# Patient Record
Sex: Female | Born: 1935 | Race: White | Hispanic: No | Marital: Married | Smoking: Never smoker
Health system: Southern US, Community
[De-identification: ages and names within clinical notes are randomized; demographics above are authoritative.]

## PROBLEM LIST (undated history)

## (undated) DIAGNOSIS — I471 Supraventricular tachycardia, unspecified: Secondary | ICD-10-CM

## (undated) DIAGNOSIS — T8859XA Other complications of anesthesia, initial encounter: Secondary | ICD-10-CM

## (undated) DIAGNOSIS — E039 Hypothyroidism, unspecified: Secondary | ICD-10-CM

## (undated) DIAGNOSIS — T4145XA Adverse effect of unspecified anesthetic, initial encounter: Secondary | ICD-10-CM

## (undated) DIAGNOSIS — K432 Incisional hernia without obstruction or gangrene: Secondary | ICD-10-CM

## (undated) DIAGNOSIS — Z87442 Personal history of urinary calculi: Secondary | ICD-10-CM

## (undated) DIAGNOSIS — Z9889 Other specified postprocedural states: Secondary | ICD-10-CM

## (undated) DIAGNOSIS — F419 Anxiety disorder, unspecified: Secondary | ICD-10-CM

## (undated) DIAGNOSIS — F0390 Unspecified dementia without behavioral disturbance: Secondary | ICD-10-CM

## (undated) DIAGNOSIS — R112 Nausea with vomiting, unspecified: Secondary | ICD-10-CM

## (undated) DIAGNOSIS — C801 Malignant (primary) neoplasm, unspecified: Secondary | ICD-10-CM

## (undated) HISTORY — PX: EYE SURGERY: SHX253

## (undated) HISTORY — PX: FRACTURE SURGERY: SHX138

## (undated) HISTORY — PX: CATARACT EXTRACTION, BILATERAL: SHX1313

## (undated) HISTORY — PX: HERNIA REPAIR: SHX51

## (undated) HISTORY — PX: THYROIDECTOMY, PARTIAL: SHX18

## (undated) HISTORY — PX: CARDIAC CATHETERIZATION: SHX172

---

## 2017-10-06 ENCOUNTER — Encounter (HOSPITAL_COMMUNITY): Payer: Self-pay | Admitting: Emergency Medicine

## 2017-10-06 ENCOUNTER — Emergency Department (HOSPITAL_COMMUNITY): Payer: Medicare Other

## 2017-10-06 ENCOUNTER — Emergency Department (HOSPITAL_COMMUNITY)
Admission: EM | Admit: 2017-10-06 | Discharge: 2017-10-06 | Disposition: A | Discharge status: Home / Self Care | Payer: Medicare Other | Source: Home / Self Care | Attending: Emergency Medicine | Admitting: Emergency Medicine

## 2017-10-06 DIAGNOSIS — E039 Hypothyroidism, unspecified: Secondary | ICD-10-CM | POA: Insufficient documentation

## 2017-10-06 DIAGNOSIS — Y999 Unspecified external cause status: Secondary | ICD-10-CM | POA: Insufficient documentation

## 2017-10-06 DIAGNOSIS — S0990XA Unspecified injury of head, initial encounter: Secondary | ICD-10-CM | POA: Insufficient documentation

## 2017-10-06 DIAGNOSIS — N390 Urinary tract infection, site not specified: Secondary | ICD-10-CM

## 2017-10-06 DIAGNOSIS — W01190A Fall on same level from slipping, tripping and stumbling with subsequent striking against furniture, initial encounter: Secondary | ICD-10-CM | POA: Insufficient documentation

## 2017-10-06 DIAGNOSIS — R0789 Other chest pain: Secondary | ICD-10-CM | POA: Insufficient documentation

## 2017-10-06 DIAGNOSIS — Y929 Unspecified place or not applicable: Secondary | ICD-10-CM | POA: Insufficient documentation

## 2017-10-06 DIAGNOSIS — Y9301 Activity, walking, marching and hiking: Secondary | ICD-10-CM | POA: Insufficient documentation

## 2017-10-06 DIAGNOSIS — W19XXXA Unspecified fall, initial encounter: Secondary | ICD-10-CM

## 2017-10-06 HISTORY — DX: Supraventricular tachycardia, unspecified: I47.10

## 2017-10-06 HISTORY — DX: Hypothyroidism, unspecified: E03.9

## 2017-10-06 HISTORY — DX: Anxiety disorder, unspecified: F41.9

## 2017-10-06 HISTORY — DX: Supraventricular tachycardia: I47.1

## 2017-10-06 HISTORY — DX: Malignant (primary) neoplasm, unspecified: C80.1

## 2017-10-06 LAB — URINALYSIS, ROUTINE W REFLEX MICROSCOPIC
Bilirubin Urine: NEGATIVE
Glucose, UA: NEGATIVE mg/dL
Ketones, ur: NEGATIVE mg/dL
Nitrite: POSITIVE — AB
Protein, ur: NEGATIVE mg/dL
Specific Gravity, Urine: 1.008 (ref 1.005–1.030)
pH: 7 (ref 5.0–8.0)

## 2017-10-06 MED ORDER — CIPROFLOXACIN HCL 500 MG PO TABS
500.0000 mg | ORAL_TABLET | Freq: Once | ORAL | Status: AC
  Administered 2017-10-06: 500 mg via ORAL
  Filled 2017-10-06: qty 1

## 2017-10-06 MED ORDER — TRAMADOL HCL 50 MG PO TABS: 50.0000 mg | ORAL_TABLET | Freq: Three times a day (TID) | ORAL | 0 refills | Status: DC | PRN

## 2017-10-06 MED ORDER — CEPHALEXIN 500 MG PO CAPS: 500.0000 mg | ORAL_CAPSULE | Freq: Once | ORAL | Status: DC

## 2017-10-06 MED ORDER — OXYCODONE-ACETAMINOPHEN 5-325 MG PO TABS
1.0000 | ORAL_TABLET | Freq: Once | ORAL | Status: AC
  Administered 2017-10-06: 1 via ORAL
  Filled 2017-10-06: qty 1

## 2017-10-06 MED ORDER — OXYCODONE-ACETAMINOPHEN 5-325 MG PO TABS
1.0000 | ORAL_TABLET | Freq: Once | ORAL | Status: DC
  Filled 2017-10-06: qty 1

## 2017-10-06 MED ORDER — CEPHALEXIN 500 MG PO CAPS: 500.0000 mg | ORAL_CAPSULE | Freq: Three times a day (TID) | ORAL | 0 refills | Status: DC

## 2017-10-06 MED ORDER — ACETAMINOPHEN 325 MG PO TABS
650.0000 mg | ORAL_TABLET | Freq: Once | ORAL | Status: DC
  Filled 2017-10-06: qty 2

## 2017-10-06 MED ORDER — CIPROFLOXACIN HCL 500 MG PO TABS: 500.0000 mg | ORAL_TABLET | Freq: Two times a day (BID) | ORAL | 0 refills | Status: DC

## 2017-10-06 NOTE — ED Notes (Signed)
Pt returned from xray

## 2017-10-06 NOTE — ED Notes (Signed)
Pt requesting stronger pain medicine than tylenol. Will talk to EDP.

## 2017-10-06 NOTE — ED Notes (Signed)
Bed: GK81 Expected date:  Expected time:  Means of arrival:  Comments: 11f fall

## 2017-10-06 NOTE — ED Notes (Signed)
Pt contact: DaughterRaoul Pitch [phone]

## 2017-10-06 NOTE — ED Triage Notes (Signed)
Pt brought in by EMS from [facility] with c/o right lateral rib cage pain after her falls---- pt has had a fall 3 days ago and hit head, and has had a mechanical fall again this morning, landing on her right side.  Pt stated, "I may have broken my ribs right here below my armpit, it hurts".  Pt also c/o headache but denies dizziness or nausea.  Pt's family remarked that she may have "UTI" causing her to have falls.

## 2017-10-06 NOTE — ED Notes (Signed)
Family at bedside reports that patient has been more confused than usual for the last 2-3 days and has been incontinent at night. Urine is noted to be cloudy by family members and pt reports painful urination. Will get urine specimen.

## 2017-10-06 NOTE — ED Provider Notes (Signed)
[facility] DEPT Provider Note   CSN: [id number] Arrival date & time: 10/06/17  7893     History   Chief Complaint Chief Complaint  Patient presents with  . Rib Cage Pain    HPI [name] is a 83 y.o. female.  HPI   83 year old female presenting after fall.  Patient describes vertigo-like symptoms while ambulating with her walker and fell.  This is her second similar fall in the past 3 days.  She is complaining primarily of left-sided chest pain but also some mild upper neck/occipital pain.  There is no loss of consciousness.  No acute neurological complaints.  She is just concerned that she may have a UTI.  She reports increased urinary frequency and incontinence.  Past Medical History:  Diagnosis Date  . Anxiety   . Hypothyroidism   . SVT (supraventricular tachycardia) (HCC)     There are no active problems to display for this patient.   Past Surgical History:  Procedure Laterality Date  . FRACTURE SURGERY    . THYROIDECTOMY, PARTIAL      OB History    No data available       Home Medications    Prior to Admission medications   Not on File    Family History History reviewed. No pertinent family history.  Social History Social History   Tobacco Use  . Smoking status: Never Smoker  . Smokeless tobacco: Never Used  Substance Use Topics  . Alcohol use: No    Frequency: Never  . Drug use: No     Allergies   Patient has no allergy information on record.   Review of Systems Review of Systems  All systems reviewed and negative, other than as noted in HPI.  Physical Exam Updated Vital Signs BP (!) 188/97   Pulse 92   Temp 98 F (36.7 C) (Oral)   Resp 18   Ht 4\' 11"  (1.499 m)   Wt 57.6 kg (127 lb)   SpO2 97%   BMI 25.65 kg/m   Physical Exam  Constitutional: She appears well-developed and well-nourished. No distress.  HENT:  Head: Normocephalic and atraumatic.  Mild tenderness to palpation in the  left occipital region.  No hematoma.  No clear midline cervical tenderness.  Eyes: Conjunctivae are normal. Right eye exhibits no discharge. Left eye exhibits no discharge.  Neck: Neck supple.  Cardiovascular: Normal rate, regular rhythm and normal heart sounds. Exam reveals no gallop and no friction rub.  No murmur heard. Pulmonary/Chest: Effort normal and breath sounds normal. No respiratory distress. She exhibits tenderness.  Crepitus.  No overlying skin changes.  Breath sounds symmetric.  Abdominal: Soft. She exhibits no distension. There is no tenderness.  Musculoskeletal: She exhibits no edema or tenderness.  Small skin tear to the dorsum left hand.  No significant bony tenderness.  No apparent pain on range of motion of the large joints. L knee surgical scar.   Neurological: She is alert.  Skin: Skin is warm and dry.  Psychiatric: She has a normal mood and affect. Her behavior is normal. Thought content normal.  Nursing note and vitals reviewed.    ED Treatments / Results  Labs (all labs ordered are listed, but only abnormal results are displayed) Labs Reviewed  URINALYSIS, ROUTINE W REFLEX MICROSCOPIC - Abnormal; Notable for the following components:      Result Value   Color, Urine AMBER (*)    Hgb urine dipstick MODERATE (*)    Nitrite POSITIVE (*)  Leukocytes, UA LARGE (*)    Bacteria, UA FEW (*)    Squamous Epithelial / LPF 0-5 (*)    All other components within normal limits  URINE CULTURE    EKG  EKG Interpretation None       Radiology No results found.  Procedures Procedures (including critical care time)  Medications Ordered in ED Medications  oxyCODONE-acetaminophen (PERCOCET/ROXICET) 5-325 MG per tablet 1 tablet (not administered)     Initial Impression / Assessment and Plan / ED Course  I have reviewed the triage vital signs and the nursing notes.  Pertinent labs & imaging results that were available during my care of the patient were  reviewed by me and considered in my medical decision making (see chart for details).     Imaging w/o acute findings. Initially was going to treat with keflex, but daughter requesting ciprofloxacin. This is reasonable for UTI. Urine culture. PRN pain meds.   Final Clinical Impressions(s) / ED Diagnoses   Final diagnoses:  Urinary tract infection without hematuria, site unspecified  Left-sided chest wall pain  Fall, initial encounter    ED Discharge Orders    None       Virgel Manifold, MD 10/06/17 1012

## 2017-10-06 NOTE — ED Notes (Signed)
ED Provider at bedside. 

## 2017-10-06 NOTE — ED Notes (Signed)
Patient transported to X-ray 

## 2017-10-06 NOTE — ED Notes (Signed)
Patient transported to CT 

## 2017-10-08 ENCOUNTER — Inpatient Hospital Stay (HOSPITAL_COMMUNITY): Payer: Medicare Other

## 2017-10-08 ENCOUNTER — Inpatient Hospital Stay (HOSPITAL_COMMUNITY): Payer: Medicare Other | Admitting: Certified Registered Nurse Anesthetist

## 2017-10-08 ENCOUNTER — Inpatient Hospital Stay (HOSPITAL_COMMUNITY)
Admission: EM | Admit: 2017-10-08 | Discharge: 2017-10-12 | DRG: 480 | Disposition: A | Discharge status: Skilled Nursing Facility | Payer: Medicare Other | Attending: Internal Medicine | Admitting: Internal Medicine

## 2017-10-08 ENCOUNTER — Other Ambulatory Visit: Payer: Self-pay

## 2017-10-08 ENCOUNTER — Emergency Department (HOSPITAL_COMMUNITY): Payer: Medicare Other

## 2017-10-08 ENCOUNTER — Encounter (HOSPITAL_COMMUNITY): Payer: Self-pay | Admitting: Certified Registered Nurse Anesthetist

## 2017-10-08 ENCOUNTER — Encounter (HOSPITAL_COMMUNITY)
Admission: EM | Disposition: A | Discharge status: Skilled Nursing Facility | Payer: Self-pay | Source: Home / Self Care | Attending: Internal Medicine

## 2017-10-08 DIAGNOSIS — W01190A Fall on same level from slipping, tripping and stumbling with subsequent striking against furniture, initial encounter: Secondary | ICD-10-CM | POA: Diagnosis present

## 2017-10-08 DIAGNOSIS — I480 Paroxysmal atrial fibrillation: Secondary | ICD-10-CM | POA: Diagnosis present

## 2017-10-08 DIAGNOSIS — Z888 Allergy status to other drugs, medicaments and biological substances status: Secondary | ICD-10-CM

## 2017-10-08 DIAGNOSIS — R0789 Other chest pain: Secondary | ICD-10-CM | POA: Diagnosis present

## 2017-10-08 DIAGNOSIS — I509 Heart failure, unspecified: Secondary | ICD-10-CM | POA: Diagnosis present

## 2017-10-08 DIAGNOSIS — Z881 Allergy status to other antibiotic agents status: Secondary | ICD-10-CM

## 2017-10-08 DIAGNOSIS — J9621 Acute and chronic respiratory failure with hypoxia: Secondary | ICD-10-CM | POA: Diagnosis present

## 2017-10-08 DIAGNOSIS — S72032A Displaced midcervical fracture of left femur, initial encounter for closed fracture: Principal | ICD-10-CM | POA: Diagnosis present

## 2017-10-08 DIAGNOSIS — Y9301 Activity, walking, marching and hiking: Secondary | ICD-10-CM | POA: Diagnosis present

## 2017-10-08 DIAGNOSIS — Z85038 Personal history of other malignant neoplasm of large intestine: Secondary | ICD-10-CM

## 2017-10-08 DIAGNOSIS — M4856XA Collapsed vertebra, not elsewhere classified, lumbar region, initial encounter for fracture: Secondary | ICD-10-CM | POA: Diagnosis present

## 2017-10-08 DIAGNOSIS — Z96652 Presence of left artificial knee joint: Secondary | ICD-10-CM | POA: Diagnosis present

## 2017-10-08 DIAGNOSIS — K5900 Constipation, unspecified: Secondary | ICD-10-CM

## 2017-10-08 DIAGNOSIS — F419 Anxiety disorder, unspecified: Secondary | ICD-10-CM | POA: Diagnosis present

## 2017-10-08 DIAGNOSIS — R413 Other amnesia: Secondary | ICD-10-CM | POA: Diagnosis present

## 2017-10-08 DIAGNOSIS — G8929 Other chronic pain: Secondary | ICD-10-CM | POA: Diagnosis present

## 2017-10-08 DIAGNOSIS — K5903 Drug induced constipation: Secondary | ICD-10-CM | POA: Diagnosis not present

## 2017-10-08 DIAGNOSIS — E039 Hypothyroidism, unspecified: Secondary | ICD-10-CM | POA: Diagnosis present

## 2017-10-08 DIAGNOSIS — Z01818 Encounter for other preprocedural examination: Secondary | ICD-10-CM

## 2017-10-08 DIAGNOSIS — Y92009 Unspecified place in unspecified non-institutional (private) residence as the place of occurrence of the external cause: Secondary | ICD-10-CM

## 2017-10-08 DIAGNOSIS — Z66 Do not resuscitate: Secondary | ICD-10-CM | POA: Diagnosis present

## 2017-10-08 DIAGNOSIS — J449 Chronic obstructive pulmonary disease, unspecified: Secondary | ICD-10-CM | POA: Diagnosis present

## 2017-10-08 DIAGNOSIS — I361 Nonrheumatic tricuspid (valve) insufficiency: Secondary | ICD-10-CM | POA: Diagnosis not present

## 2017-10-08 DIAGNOSIS — Z885 Allergy status to narcotic agent status: Secondary | ICD-10-CM

## 2017-10-08 DIAGNOSIS — R42 Dizziness and giddiness: Secondary | ICD-10-CM | POA: Diagnosis present

## 2017-10-08 DIAGNOSIS — Z880 Allergy status to penicillin: Secondary | ICD-10-CM | POA: Diagnosis not present

## 2017-10-08 DIAGNOSIS — I471 Supraventricular tachycardia: Secondary | ICD-10-CM | POA: Diagnosis present

## 2017-10-08 DIAGNOSIS — Z8249 Family history of ischemic heart disease and other diseases of the circulatory system: Secondary | ICD-10-CM | POA: Diagnosis not present

## 2017-10-08 DIAGNOSIS — S72009A Fracture of unspecified part of neck of unspecified femur, initial encounter for closed fracture: Secondary | ICD-10-CM

## 2017-10-08 DIAGNOSIS — T40605A Adverse effect of unspecified narcotics, initial encounter: Secondary | ICD-10-CM | POA: Diagnosis not present

## 2017-10-08 DIAGNOSIS — S72002A Fracture of unspecified part of neck of left femur, initial encounter for closed fracture: Secondary | ICD-10-CM

## 2017-10-08 DIAGNOSIS — W19XXXA Unspecified fall, initial encounter: Secondary | ICD-10-CM

## 2017-10-08 DIAGNOSIS — Z1623 Resistance to quinolones and fluoroquinolones: Secondary | ICD-10-CM | POA: Diagnosis present

## 2017-10-08 DIAGNOSIS — Z79890 Hormone replacement therapy (postmenopausal): Secondary | ICD-10-CM

## 2017-10-08 DIAGNOSIS — N390 Urinary tract infection, site not specified: Secondary | ICD-10-CM | POA: Diagnosis present

## 2017-10-08 DIAGNOSIS — S0990XA Unspecified injury of head, initial encounter: Secondary | ICD-10-CM | POA: Diagnosis present

## 2017-10-08 DIAGNOSIS — Y92230 Patient room in hospital as the place of occurrence of the external cause: Secondary | ICD-10-CM | POA: Diagnosis not present

## 2017-10-08 DIAGNOSIS — K219 Gastro-esophageal reflux disease without esophagitis: Secondary | ICD-10-CM | POA: Diagnosis present

## 2017-10-08 DIAGNOSIS — B9620 Unspecified Escherichia coli [E. coli] as the cause of diseases classified elsewhere: Secondary | ICD-10-CM | POA: Diagnosis present

## 2017-10-08 DIAGNOSIS — Z419 Encounter for procedure for purposes other than remedying health state, unspecified: Secondary | ICD-10-CM

## 2017-10-08 DIAGNOSIS — Z79899 Other long term (current) drug therapy: Secondary | ICD-10-CM

## 2017-10-08 HISTORY — PX: HIP PINNING,CANNULATED: SHX1758

## 2017-10-08 LAB — CBC WITH DIFFERENTIAL/PLATELET
BASOS ABS: 0 10*3/uL (ref 0.0–0.1)
Basophils Relative: 0 %
Eosinophils Absolute: 0 10*3/uL (ref 0.0–0.7)
Eosinophils Relative: 0 %
HEMATOCRIT: 44.6 % (ref 36.0–46.0)
HEMOGLOBIN: 15.5 g/dL — AB (ref 12.0–15.0)
LYMPHS PCT: 15 %
Lymphs Abs: 1.2 10*3/uL (ref 0.7–4.0)
MCH: 32.6 pg (ref 26.0–34.0)
MCHC: 34.8 g/dL (ref 30.0–36.0)
MCV: 93.9 fL (ref 78.0–100.0)
MONO ABS: 1 10*3/uL (ref 0.1–1.0)
Monocytes Relative: 13 %
NEUTROS ABS: 5.4 10*3/uL (ref 1.7–7.7)
NEUTROS PCT: 72 %
Platelets: 187 10*3/uL (ref 150–400)
RBC: 4.75 MIL/uL (ref 3.87–5.11)
RDW: 13.1 % (ref 11.5–15.5)
WBC: 7.5 10*3/uL (ref 4.0–10.5)

## 2017-10-08 LAB — URINE CULTURE: Culture: >=100000 — AB

## 2017-10-08 LAB — BASIC METABOLIC PANEL
ANION GAP: 12 (ref 5–15)
BUN: 8 mg/dL (ref 6–20)
CHLORIDE: 97 mmol/L — AB (ref 101–111)
CO2: 25 mmol/L (ref 22–32)
Calcium: 10 mg/dL (ref 8.9–10.3)
Creatinine, Ser: 0.71 mg/dL (ref 0.44–1.00)
GFR calc non Af Amer: >60 mL/min (ref >60)
GLUCOSE: 121 mg/dL — AB (ref 65–99)
POTASSIUM: 4.4 mmol/L (ref 3.5–5.1)
Sodium: 134 mmol/L — ABNORMAL LOW (ref 135–145)

## 2017-10-08 SURGERY — FIXATION, FEMUR, NECK, PERCUTANEOUS, USING SCREW
Anesthesia: General | Laterality: Left

## 2017-10-08 MED ORDER — METHOCARBAMOL 1000 MG/10ML IJ SOLN
500.0000 mg | Freq: Four times a day (QID) | INTRAVENOUS | Status: DC | PRN
  Filled 2017-10-08: qty 5

## 2017-10-08 MED ORDER — ONDANSETRON HCL 4 MG/2ML IJ SOLN
INTRAMUSCULAR | Status: DC | PRN
  Administered 2017-10-08: 4 mg via INTRAVENOUS

## 2017-10-08 MED ORDER — METOCLOPRAMIDE HCL 5 MG/ML IJ SOLN: 5.0000 mg | Freq: Three times a day (TID) | INTRAMUSCULAR | Status: DC | PRN

## 2017-10-08 MED ORDER — LEVOTHYROXINE SODIUM 100 MCG PO TABS
100.0000 ug | ORAL_TABLET | Freq: Every day | ORAL | Status: DC
  Administered 2017-10-09 – 2017-10-12 (×4): 100 ug via ORAL
  Filled 2017-10-08 (×5): qty 1

## 2017-10-08 MED ORDER — METOCLOPRAMIDE HCL 5 MG PO TABS: 5.0000 mg | ORAL_TABLET | Freq: Three times a day (TID) | ORAL | Status: DC | PRN

## 2017-10-08 MED ORDER — SODIUM CHLORIDE 1 G PO TABS
1.0000 g | ORAL_TABLET | Freq: Two times a day (BID) | ORAL | Status: DC
  Administered 2017-10-08 – 2017-10-12 (×8): 1 g via ORAL
  Filled 2017-10-08 (×8): qty 1

## 2017-10-08 MED ORDER — ESMOLOL HCL 100 MG/10ML IV SOLN
INTRAVENOUS | Status: DC | PRN
  Administered 2017-10-08: 20 mg via INTRAVENOUS
  Administered 2017-10-08: 30 mg via INTRAVENOUS
  Administered 2017-10-08: 25 mg via INTRAVENOUS

## 2017-10-08 MED ORDER — FENTANYL CITRATE (PF) 100 MCG/2ML IJ SOLN
INTRAMUSCULAR | Status: DC | PRN
  Administered 2017-10-08: 50 ug via INTRAVENOUS
  Administered 2017-10-08: 25 ug via INTRAVENOUS
  Administered 2017-10-08: 10 ug via INTRAVENOUS
  Administered 2017-10-08: 15 ug via INTRAVENOUS

## 2017-10-08 MED ORDER — ONDANSETRON HCL 4 MG/2ML IJ SOLN
INTRAMUSCULAR | Status: AC
  Filled 2017-10-08: qty 2

## 2017-10-08 MED ORDER — ENOXAPARIN SODIUM 40 MG/0.4ML ~~LOC~~ SOLN
40.0000 mg | SUBCUTANEOUS | Status: DC
  Administered 2017-10-09: 08:00:00 40 mg via SUBCUTANEOUS
  Filled 2017-10-08: qty 0.4

## 2017-10-08 MED ORDER — CEFAZOLIN SODIUM-DEXTROSE 2-4 GM/100ML-% IV SOLN
2.0000 g | Freq: Four times a day (QID) | INTRAVENOUS | Status: AC
  Administered 2017-10-09 (×2): 2 g via INTRAVENOUS
  Filled 2017-10-08 (×2): qty 100

## 2017-10-08 MED ORDER — MORPHINE SULFATE (PF) 4 MG/ML IV SOLN: 0.5000 mg | INTRAVENOUS | Status: DC | PRN

## 2017-10-08 MED ORDER — FENTANYL CITRATE (PF) 100 MCG/2ML IJ SOLN
25.0000 ug | Freq: Once | INTRAMUSCULAR | Status: AC
  Administered 2017-10-08: 25 ug via INTRAVENOUS
  Filled 2017-10-08: qty 2

## 2017-10-08 MED ORDER — ONDANSETRON HCL 4 MG/2ML IJ SOLN: 4.0000 mg | Freq: Once | INTRAMUSCULAR | Status: DC | PRN

## 2017-10-08 MED ORDER — PHENYLEPHRINE 40 MCG/ML (10ML) SYRINGE FOR IV PUSH (FOR BLOOD PRESSURE SUPPORT)
PREFILLED_SYRINGE | INTRAVENOUS | Status: AC
  Filled 2017-10-08: qty 10

## 2017-10-08 MED ORDER — HYDROMORPHONE HCL 1 MG/ML IJ SOLN: 0.1000 mg | INTRAMUSCULAR | Status: DC | PRN

## 2017-10-08 MED ORDER — FENTANYL CITRATE (PF) 100 MCG/2ML IJ SOLN: 25.0000 ug | INTRAMUSCULAR | Status: DC | PRN

## 2017-10-08 MED ORDER — SODIUM CHLORIDE 0.9 % IV SOLN
INTRAVENOUS | Status: DC
  Administered 2017-10-09: 14:00:00 via INTRAVENOUS

## 2017-10-08 MED ORDER — LIDOCAINE 2% (20 MG/ML) 5 ML SYRINGE
INTRAMUSCULAR | Status: AC
  Filled 2017-10-08: qty 5

## 2017-10-08 MED ORDER — ALPRAZOLAM 0.5 MG PO TABS
0.5000 mg | ORAL_TABLET | Freq: Four times a day (QID) | ORAL | Status: DC
  Administered 2017-10-08 – 2017-10-12 (×14): 0.5 mg via ORAL
  Filled 2017-10-08 (×14): qty 1

## 2017-10-08 MED ORDER — NITROFURANTOIN MONOHYD MACRO 100 MG PO CAPS
100.0000 mg | ORAL_CAPSULE | Freq: Once | ORAL | Status: AC
  Administered 2017-10-08: 100 mg via ORAL
  Filled 2017-10-08: qty 1

## 2017-10-08 MED ORDER — DOCUSATE SODIUM 100 MG PO CAPS
100.0000 mg | ORAL_CAPSULE | Freq: Two times a day (BID) | ORAL | Status: DC
  Administered 2017-10-08 – 2017-10-12 (×8): 100 mg via ORAL
  Filled 2017-10-08 (×8): qty 1

## 2017-10-08 MED ORDER — SENNOSIDES 8.6 MG PO TABS: 1.0000 | ORAL_TABLET | Freq: Every day | ORAL | Status: DC | PRN

## 2017-10-08 MED ORDER — HYDROCODONE-ACETAMINOPHEN 5-325 MG PO TABS
1.0000 | ORAL_TABLET | Freq: Four times a day (QID) | ORAL | Status: DC | PRN
  Administered 2017-10-10: 07:00:00 1 via ORAL
  Filled 2017-10-08: qty 1
  Filled 2017-10-08: qty 2

## 2017-10-08 MED ORDER — LORATADINE 10 MG PO TABS: 10.0000 mg | ORAL_TABLET | Freq: Every day | ORAL | Status: DC | PRN

## 2017-10-08 MED ORDER — DEXTROSE 5 % IV SOLN
1.0000 g | Freq: Once | INTRAVENOUS | Status: DC
  Filled 2017-10-08: qty 10

## 2017-10-08 MED ORDER — ACETAMINOPHEN 650 MG RE SUPP: 650.0000 mg | Freq: Four times a day (QID) | RECTAL | Status: DC | PRN

## 2017-10-08 MED ORDER — PROPOFOL 10 MG/ML IV BOLUS
INTRAVENOUS | Status: DC | PRN
  Administered 2017-10-08 (×3): 30 mg via INTRAVENOUS

## 2017-10-08 MED ORDER — DEXAMETHASONE SODIUM PHOSPHATE 4 MG/ML IJ SOLN
INTRAMUSCULAR | Status: DC | PRN
  Administered 2017-10-08: 5 mg via INTRAVENOUS

## 2017-10-08 MED ORDER — PANTOPRAZOLE SODIUM 40 MG PO TBEC
40.0000 mg | DELAYED_RELEASE_TABLET | Freq: Every day | ORAL | Status: DC
  Administered 2017-10-09 – 2017-10-12 (×4): 40 mg via ORAL
  Filled 2017-10-08 (×4): qty 1

## 2017-10-08 MED ORDER — BUPIVACAINE-EPINEPHRINE 0.25% -1:200000 IJ SOLN
INTRAMUSCULAR | Status: DC | PRN
  Administered 2017-10-08: 20 mL

## 2017-10-08 MED ORDER — FLEET ENEMA 7-19 GM/118ML RE ENEM
1.0000 | ENEMA | Freq: Once | RECTAL | Status: AC | PRN
  Administered 2017-10-11: 11:00:00 1 via RECTAL
  Filled 2017-10-08: qty 1

## 2017-10-08 MED ORDER — POLYETHYLENE GLYCOL 3350 17 G PO PACK: 17.0000 g | PACK | Freq: Every day | ORAL | Status: DC | PRN

## 2017-10-08 MED ORDER — PHENOL 1.4 % MT LIQD
1.0000 | OROMUCOSAL | Status: DC | PRN
  Filled 2017-10-08: qty 177

## 2017-10-08 MED ORDER — SENNA 8.6 MG PO TABS
1.0000 | ORAL_TABLET | Freq: Every day | ORAL | Status: DC | PRN
  Administered 2017-10-11 – 2017-10-12 (×2): 8.6 mg via ORAL
  Filled 2017-10-08 (×2): qty 1

## 2017-10-08 MED ORDER — FENTANYL CITRATE (PF) 100 MCG/2ML IJ SOLN
INTRAMUSCULAR | Status: AC
  Filled 2017-10-08: qty 2

## 2017-10-08 MED ORDER — DEXAMETHASONE SODIUM PHOSPHATE 10 MG/ML IJ SOLN
INTRAMUSCULAR | Status: AC
  Filled 2017-10-08: qty 1

## 2017-10-08 MED ORDER — BUPIVACAINE-EPINEPHRINE (PF) 0.25% -1:200000 IJ SOLN
INTRAMUSCULAR | Status: AC
  Filled 2017-10-08: qty 30

## 2017-10-08 MED ORDER — ONDANSETRON HCL 4 MG/2ML IJ SOLN: 4.0000 mg | Freq: Four times a day (QID) | INTRAMUSCULAR | Status: DC | PRN

## 2017-10-08 MED ORDER — ESMOLOL HCL 100 MG/10ML IV SOLN
INTRAVENOUS | Status: AC
  Filled 2017-10-08: qty 10

## 2017-10-08 MED ORDER — ACETAMINOPHEN 325 MG PO TABS
650.0000 mg | ORAL_TABLET | Freq: Four times a day (QID) | ORAL | Status: DC | PRN
  Administered 2017-10-11: 650 mg via ORAL
  Filled 2017-10-08: qty 2

## 2017-10-08 MED ORDER — METHOCARBAMOL 500 MG PO TABS
500.0000 mg | ORAL_TABLET | Freq: Four times a day (QID) | ORAL | Status: DC | PRN
  Administered 2017-10-10 – 2017-10-11 (×2): 500 mg via ORAL
  Filled 2017-10-08 (×2): qty 1

## 2017-10-08 MED ORDER — ONDANSETRON HCL 4 MG PO TABS: 4.0000 mg | ORAL_TABLET | Freq: Four times a day (QID) | ORAL | Status: DC | PRN

## 2017-10-08 MED ORDER — BISACODYL 10 MG RE SUPP
10.0000 mg | Freq: Every day | RECTAL | Status: DC | PRN
  Administered 2017-10-10: 13:00:00 10 mg via RECTAL
  Filled 2017-10-08 (×2): qty 1

## 2017-10-08 MED ORDER — AMIODARONE HCL 100 MG PO TABS
100.0000 mg | ORAL_TABLET | Freq: Every day | ORAL | Status: DC
Start: 2017-10-09 — End: 2017-10-12
  Administered 2017-10-09 – 2017-10-12 (×4): 100 mg via ORAL
  Filled 2017-10-08 (×4): qty 1

## 2017-10-08 MED ORDER — PROPOFOL 10 MG/ML IV BOLUS
INTRAVENOUS | Status: AC
  Filled 2017-10-08: qty 20

## 2017-10-08 MED ORDER — CEFAZOLIN SODIUM-DEXTROSE 2-4 GM/100ML-% IV SOLN
INTRAVENOUS | Status: AC
  Filled 2017-10-08: qty 100

## 2017-10-08 MED ORDER — LACTATED RINGERS IV SOLN
INTRAVENOUS | Status: DC | PRN
  Administered 2017-10-08: 18:00:00 via INTRAVENOUS

## 2017-10-08 MED ORDER — PHENYLEPHRINE 40 MCG/ML (10ML) SYRINGE FOR IV PUSH (FOR BLOOD PRESSURE SUPPORT)
PREFILLED_SYRINGE | INTRAVENOUS | Status: DC | PRN
  Administered 2017-10-08 (×3): 80 ug via INTRAVENOUS

## 2017-10-08 MED ORDER — OXYCODONE HCL 5 MG PO TABS
5.0000 mg | ORAL_TABLET | ORAL | Status: DC | PRN
  Administered 2017-10-08: 5 mg via ORAL
  Filled 2017-10-08: qty 1

## 2017-10-08 MED ORDER — FENTANYL CITRATE (PF) 100 MCG/2ML IJ SOLN: 50.0000 ug | Freq: Once | INTRAMUSCULAR | Status: DC

## 2017-10-08 MED ORDER — CEFAZOLIN SODIUM-DEXTROSE 2-4 GM/100ML-% IV SOLN
2.0000 g | Freq: Once | INTRAVENOUS | Status: AC
  Administered 2017-10-08: 2 g via INTRAVENOUS

## 2017-10-08 MED ORDER — MENTHOL 3 MG MT LOZG: 1.0000 | LOZENGE | OROMUCOSAL | Status: DC | PRN

## 2017-10-08 SURGICAL SUPPLY — 42 items
BAG ZIPLOCK 12X15 (MISCELLANEOUS) IMPLANT
BNDG GAUZE ELAST 4 BULKY (GAUZE/BANDAGES/DRESSINGS) ×3 IMPLANT
CLOSURE WOUND 1/2 X4 (GAUZE/BANDAGES/DRESSINGS) ×2
COVER SURGICAL LIGHT HANDLE (MISCELLANEOUS) ×3 IMPLANT
DRAPE STERI IOBAN 125X83 (DRAPES) ×3 IMPLANT
DRSG EMULSION OIL 3X16 NADH (GAUZE/BANDAGES/DRESSINGS) ×3 IMPLANT
DRSG MEPILEX BORDER 4X4 (GAUZE/BANDAGES/DRESSINGS) IMPLANT
DRSG MEPILEX BORDER 4X8 (GAUZE/BANDAGES/DRESSINGS) IMPLANT
DRSG PAD ABDOMINAL 8X10 ST (GAUZE/BANDAGES/DRESSINGS) ×3 IMPLANT
DURAPREP 26ML APPLICATOR (WOUND CARE) ×3 IMPLANT
ELECT REM PT RETURN 15FT ADLT (MISCELLANEOUS) ×3 IMPLANT
EVACUATOR 1/8 PVC DRAIN (DRAIN) IMPLANT
GAUZE SPONGE 4X4 12PLY STRL (GAUZE/BANDAGES/DRESSINGS) ×3 IMPLANT
GLOVE BIO SURGEON STRL SZ7.5 (GLOVE) ×3 IMPLANT
GLOVE BIO SURGEON STRL SZ8 (GLOVE) ×18 IMPLANT
GLOVE BIOGEL PI IND STRL 8 (GLOVE) ×3 IMPLANT
GLOVE BIOGEL PI INDICATOR 8 (GLOVE) ×6
GOWN STRL REUS W/TWL LRG LVL3 (GOWN DISPOSABLE) ×6 IMPLANT
GOWN STRL REUS W/TWL XL LVL3 (GOWN DISPOSABLE) IMPLANT
MANIFOLD NEPTUNE II (INSTRUMENTS) ×3 IMPLANT
NEEDLE PRECISIONGLIDE 27X1.5 (NEEDLE) ×3 IMPLANT
NS IRRIG 1000ML POUR BTL (IV SOLUTION) ×3 IMPLANT
PACK GENERAL/GYN (CUSTOM PROCEDURE TRAY) ×3 IMPLANT
PAD CAST 4YDX4 CTTN HI CHSV (CAST SUPPLIES) ×1 IMPLANT
PADDING CAST COTTON 4X4 STRL (CAST SUPPLIES) ×2
PADDING CAST COTTON 6X4 STRL (CAST SUPPLIES) ×6 IMPLANT
PIN THREADED GUIDE ACE (PIN) ×3 IMPLANT
POSITIONER SURGICAL ARM (MISCELLANEOUS) IMPLANT
SCREW CANN 6.5 85MM (Screw) ×4 IMPLANT
SCREW CANN 6.5 90MM (Screw) ×2 IMPLANT
SCREW CANN LG 6.5 FLT 85X22 (Screw) ×2 IMPLANT
SCREW CANN LG 6.5 FLT 90X22 (Screw) ×1 IMPLANT
SPONGE LAP 18X18 X RAY DECT (DISPOSABLE) IMPLANT
STRIP CLOSURE SKIN 1/2X4 (GAUZE/BANDAGES/DRESSINGS) ×4 IMPLANT
SUT MNCRL AB 4-0 PS2 18 (SUTURE) ×3 IMPLANT
SUT VIC AB 1 CT1 36 (SUTURE) IMPLANT
SUT VIC AB 2-0 CT1 27 (SUTURE) ×2
SUT VIC AB 2-0 CT1 TAPERPNT 27 (SUTURE) ×1 IMPLANT
SYR CONTROL 10ML LL (SYRINGE) ×3 IMPLANT
TOWEL OR 17X26 10 PK STRL BLUE (TOWEL DISPOSABLE) ×3 IMPLANT
TRAY FOLEY W/METER SILVER 16FR (SET/KITS/TRAYS/PACK) ×3 IMPLANT
WATER STERILE IRR 1000ML POUR (IV SOLUTION) ×3 IMPLANT

## 2017-10-08 NOTE — ED Provider Notes (Signed)
Medical screening examination/treatment/procedure(s) were conducted as a shared visit with non-physician practitioner(s) and myself.  I personally evaluated the patient during the encounter.   EKG Interpretation None      88yF with L hip and lower back pain after fall. I actually saw her in the ED two days ago with L chest pain after fall. Palin films negative. Diagnosed with UTI at that time and started on cipro. Culture data resulted this morning and resistant. Allergies to bactrim. Daughter says she is also allergic to cephalexin which is why it wasn't prescribed two days ago. Sensitive to macrobid.  Dose ordered. Several previous orthopedic surgeries through [facility]. She/family would prefer to stay locally. Ortho consult. Medicine admit. L2 compression fx of unclear chronicity.    Virgel Manifold, MD 10/09/17 [phone]

## 2017-10-08 NOTE — ED Notes (Signed)
Consent has been signed by daughter and placed in patient chart. Patient prepared and prepped for OR.

## 2017-10-08 NOTE — ED Notes (Signed)
Report given to OR nurse

## 2017-10-08 NOTE — Interval H&P Note (Signed)
History and Physical Interval Note:  10/08/2017 5:28 PM  Marrisa Brubacher  has presented today for surgery, with the diagnosis of left femoral neck fracture  The various methods of treatment have been discussed with the patient and family. After consideration of risks, benefits and other options for treatment, the patient has consented to  Procedure(s): CANNULATED HIP PINNING (Left) as a surgical intervention .  The patient's history has been reviewed, patient examined, no change in status, stable for surgery.  I have reviewed the patient's chart and labs.  Questions were answered to the patient's satisfaction.     Pilar Plate Nissa Stannard

## 2017-10-08 NOTE — Anesthesia Procedure Notes (Signed)
Procedure Name: LMA Insertion Date/Time: 10/08/2017 5:44 PM Performed by: Claudia Desanctis, CRNA Pre-anesthesia Checklist: Emergency Drugs available, Patient identified, Suction available and Patient being monitored Patient Re-evaluated:Patient Re-evaluated prior to induction Oxygen Delivery Method: Circle system utilized Preoxygenation: Pre-oxygenation with 100% oxygen Induction Type: IV induction Ventilation: Mask ventilation without difficulty LMA: LMA inserted LMA Size: 4.0 Number of attempts: 1 Placement Confirmation: positive ETCO2 and breath sounds checked- equal and bilateral Tube secured with: Tape Dental Injury: Teeth and Oropharynx as per pre-operative assessment  Comments: Placed a 3 LMA. But leak too great , replaced with 4

## 2017-10-08 NOTE — Op Note (Signed)
OPERATIVE REPORT   POSTOPERATIVE DIAGNOSIS: Left femoral neck fracture.   POSTOPERATIVE DIAGNOSIS: Left  femoral neck fracture.   PROCEDURE: In situ pinning, Left hip.   SURGEON: Gaynelle Arabian, M.D.   ASSISTANT: None.   ANESTHESIA: General.   ESTIMATED BLOOD LOSS: 50 ml   DRAINS: None.   COMPLICATIONS: None.   CONDITION: Stable to recovery.   BRIEF CLINICAL NOTE: Raven Snyder is an 83 y.o. female, had a fall earlier today, sustaining a partially displaced  Left femoral neck  fracture. The patient has intense pain in the Left hip.They have been cleared medically. The patient presents for in pinning of the Left femoral neck fracture. She has an intramedullary nail in the left femur and we discussed options of removing the nail and doing a hip replacement versus reducing the fracture and pinning it. Pros and cons of each were discussed and she and her family opted for the pinning. PROCEDURE IN DETAIL: After successful administration of General. anesthetic, the patient was placed on the fracture table. The Left lower extremity in well-padded traction boot, Right lower extremity in well-padded leg holder. I applied gentle traction and rotation under fluoroscopic guidance and reduced the fracture into nearly anatomic position.. The C-arm spot AP and lateral confirmed that it  was in good position. Thigh was then prepped  and draped in usual sterile fashion. The guide pin for the [employer]  cannulated screws was placed over the top of the thigh   so that it projects in the center of femoral head  and neck on AP fluoroscopic view. This served as a marker for the incision which was made over the lateral part of the femur. About a 2-inch incision was then  made at the appropriate place. The skin was cut through the  subcutaneous tissue to the fascia lata, which was incised in line with  skin incision. Guidepin was passed so that this is in the center of  femoral head and neck on the AP view.  Another one was placed inferior  to this and slightly posterior and another one was placed superior to  this and slightly posterior. The lengths are 85, 85, and 90 mm  respectively. The screws were passed over the guidepin and tightened  down to the lateral cortex of the femur with excellent purchase of the  screws. It effectively compressed the fracture site. The guidepins  were removed. Fluoro spots were taken AP and lateral, confirming excellent  position of hardware. The incision was then cleaned and dried, and the  deep tissue was closed with interrupted 2-0 Vicryl, subcu with  interrupted 2-0 Vicryl, subcuticular running 4-0 Monocryl.  20 mL of 0.25% Marcaine  with epinephrine was injected into the subcutaneous tissues. The  incision was cleaned and dried. Steri-Strips and sterile dressing  applied. The patient was awakened and transported to recovery in stable  condition.   Gaynelle Arabian, M.D.

## 2017-10-08 NOTE — ED Notes (Signed)
Patient transported to X-ray 

## 2017-10-08 NOTE — ED Triage Notes (Signed)
Transported by [employer] from [place] @ [facility]. Witnessed fall attempting to transfer from wheelchair. Reports left hip pain, per EMS there is shortening to the LLE. Alert and oriented x 4, denies any head/neck or back pain. (-) LOC

## 2017-10-08 NOTE — Anesthesia Preprocedure Evaluation (Addendum)
Anesthesia Evaluation  Patient identified by MRN, date of birth, ID band Patient awake    Reviewed: Allergy & Precautions, NPO status , Patient's Chart, lab work & pertinent test results  Airway Mallampati: II  TM Distance: >3 FB Neck ROM: Full    Dental  (+) Lower Dentures, Upper Dentures   Pulmonary neg pulmonary ROS,    Pulmonary exam normal breath sounds clear to auscultation       Cardiovascular Normal cardiovascular exam+ dysrhythmias Supra Ventricular Tachycardia  Rhythm:Regular Rate:Normal  SVT (supraventricular tachycardia)  ECG: rate 116  Sees cardiologist ([facility])   Neuro/Psych Anxiety negative neurological ROS     GI/Hepatic Neg liver ROS, GERD  Controlled and Medicated,colon cancer   Endo/Other  Hypothyroidism   Renal/GU negative Renal ROS     Musculoskeletal negative musculoskeletal ROS (+)   Abdominal   Peds  Hematology negative hematology ROS (+)   Anesthesia Other Findings   Reproductive/Obstetrics                            Anesthesia Physical Anesthesia Plan  ASA: III and emergent  Anesthesia Plan: General   Post-op Pain Management:    Induction: Intravenous  PONV Risk Score and Plan: 2 and Dexamethasone and Ondansetron  Airway Management Planned: LMA  Additional Equipment:   Intra-op Plan:   Post-operative Plan: Extubation in OR  Informed Consent: I have reviewed the patients History and Physical, chart, labs and discussed the procedure including the risks, benefits and alternatives for the proposed anesthesia with the patient or authorized representative who has indicated his/her understanding and acceptance.   Dental advisory given  Plan Discussed with: CRNA  Anesthesia Plan Comments:         Anesthesia Quick Evaluation

## 2017-10-08 NOTE — ED Provider Notes (Signed)
[facility] DEPT Provider Note   CSN: [id number] Arrival date & time: 10/08/17  1042     History   Chief Complaint Chief Complaint  Patient presents with  . Fall    HPI Raven Snyder is a 83 y.o. female past medical history of hypothyroidism, left femur fracture s/p ORIF, lumbar compression fractures, presenting to the ED status post occurred prior to arrival at [employer] home.  Pt currently being treated for UTI since ED visit on 10/06/17, and has been having problems with balance this week.  States she thinks her loss she lost her balance, falling down onto her left side.  Denies head trauma or LOC.  Reports left-sided hip pain and lower back pain.  Denies headache, vision changes, chest pain, abdominal pain, nausea, neck pain, or any other injuries or complaints.  She is not currently on anticoagulation.  She is currently taking ciprofloxacin for UTI. Surgical history includes ORIF of femur fracture with intramedullary rod.  The history is provided by the patient and a relative.    Past Medical History:  Diagnosis Date  . Anxiety   . colon ca dx'd 1999  . Hypothyroidism   . SVT (supraventricular tachycardia) [facility])     Patient Active Problem List   Diagnosis Date Noted  . Hip fracture ([place]) 10/08/2017  . Hypothyroidism 10/08/2017  . Acute lower UTI 10/08/2017  . Anxiety 10/08/2017    Past Surgical History:  Procedure Laterality Date  . FRACTURE SURGERY    . THYROIDECTOMY, PARTIAL      OB History    No data available       Home Medications    Prior to Admission medications   Medication Sig Start Date End Date Taking? Authorizing Provider  acetaminophen (TYLENOL) 500 MG tablet Take 1,000 mg 2 (two) times daily by mouth.   Yes [provider]  ALPRAZolam (XANAX) 0.5 MG tablet Take 0.5 mg 4 (four) times daily by mouth.   Yes [provider]  amiodarone (PACERONE) 100 MG tablet Take 100 mg daily by mouth.    Yes [provider]  levothyroxine (SYNTHROID, LEVOTHROID) 100 MCG tablet Take 100 mcg daily before breakfast by mouth.   Yes [provider]  loratadine (CLARITIN) 10 MG tablet Take 10 mg daily as needed by mouth for allergies.   Yes [provider]  omeprazole (PRILOSEC) 20 MG capsule Take 20 mg daily by mouth.   Yes [provider]  phenazopyridine (AZO-STANDARD) 95 MG tablet Take 95 mg 2 (two) times daily as needed by mouth for pain.   Yes [provider]  senna (SENOKOT) 8.6 MG tablet Take 1 tablet daily as needed by mouth for constipation.   Yes [provider]  sodium chloride 1 g tablet Take 1 g 2 (two) times daily by mouth.   Yes [provider]    Family History No family history on file.  Social History Social History   Tobacco Use  . Smoking status: Never Smoker  . Smokeless tobacco: Never Used  Substance Use Topics  . Alcohol use: No    Frequency: Never  . Drug use: No     Allergies   Accupril [quinapril hcl]; Bactrim [sulfamethoxazole-trimethoprim]; Clindamycin/lincomycin; Mold extract [trichophyton]; Morphine and related; Nsaids; Penicillins; Shellfish allergy; Tolmetin; Tramadol; Yeast-related products; Zantac [ranitidine hcl]; and Doxycycline   Review of Systems Review of Systems  Eyes: Negative for visual disturbance.  Respiratory: Negative for shortness of breath.   Cardiovascular: Negative for chest  pain.  Gastrointestinal: Negative for abdominal pain and nausea.  Musculoskeletal: Positive for arthralgias and back pain. Negative for neck pain.  Neurological: Negative for headaches.  Hematological: Does not bruise/bleed easily.  Psychiatric/Behavioral: Negative for confusion.  All other systems reviewed and are negative.    Physical Exam Updated Vital Signs BP (!) 158/91 (BP Location: Right Arm)   Pulse (!) 104   Temp (!) 97.4 F (36.3 C) (Oral)   Resp 18   SpO2 97%   Physical Exam    Constitutional: She is oriented to person, place, and time. She appears well-developed and well-nourished. No distress.  She is not in distress.  Hard of hearing.  HENT:  Head: Normocephalic and atraumatic.  Mouth/Throat: Oropharynx is clear and moist.  No scalp Tenderness or hematoma.  Eyes: Conjunctivae and EOM are normal. Pupils are equal, round, and reactive to light.  Neck: Normal range of motion. Neck supple.  Cardiovascular: Normal rate, regular rhythm, normal heart sounds and intact distal pulses.  Pulmonary/Chest: Effort normal and breath sounds normal. No respiratory distress. She exhibits no tenderness.  Abdominal: Soft. Bowel sounds are normal. She exhibits no distension. There is no tenderness. There is no guarding.  Musculoskeletal:  Left leg appears externally rotated and shorter than the right.  Tenderness with palpation of lateral left hip and left lower back.  Left lower extremity with intact distal pulses, patient actively moving toes, with normal sensation.   Right lower extremity without tenderness  Pelvis appears stable.  No obvious injury or tenderness to bilateral upper extremities. No midline C-spine tenderness. Neck w normal ROM.  Neurological: She is alert and oriented to person, place, and time.  Mental Status:  Alert, oriented, thought content appropriate, able to give a coherent history. Speech fluent without evidence of aphasia. Able to follow 2 step commands without difficulty.  Cranial Nerves:  II:  Peripheral visual fields grossly normal, pupils equal, round, reactive to light III,IV, VI: ptosis not present, extra-ocular motions intact bilaterally  V,VII: smile symmetric, facial light touch sensation equal VIII: hearing grossly normal to voice  X: uvula elevates symmetrically  XI: bilateral shoulder shrug symmetric and strong XII: midline tongue extension without fassiculations Motor:  Normal tone. 5/5 in upper extremities bilaterally including strong  and equal grip strength  Sensory: Pinprick and light touch normal in all extremities.  Deep Tendon Reflexes: 2+ and symmetric in the biceps and patella Cerebellar: normal finger-to-nose with bilateral upper extremities CV: distal pulses palpable throughout    Skin: Skin is warm.  Psychiatric: She has a normal mood and affect. Her behavior is normal.  Nursing note and vitals reviewed.    ED Treatments / Results  Labs (all labs ordered are listed, but only abnormal results are displayed) Labs Reviewed  CBC WITH DIFFERENTIAL/PLATELET - Abnormal; Notable for the following components:      Result Value   Hemoglobin 15.5 (*)    All other components within normal limits  BASIC METABOLIC PANEL - Abnormal; Notable for the following components:   Sodium 134 (*)    Chloride 97 (*)    Glucose, Bld 121 (*)    All other components within normal limits    EKG  EKG Interpretation None       Radiology Dg Lumbar Spine Complete  Result Date: 10/08/2017 CLINICAL DATA:  Left hip pain secondary to a fall from a wheelchair today. EXAM: LUMBAR SPINE - COMPLETE 4+ VIEW COMPARISON:  Radiographs of the chest and left ribs dated 10/06/2017 FINDINGS: There  are compression fractures of the superior and inferior endplates of L2. Slight retrolisthesis of L2 on L3. There is a prominent Schmorl's node in the superior endplate of L1, probably not acute. There is marked osteopenia. Grade 1 spondylolisthesis at L4-5. No significant disc space narrowing. Aortic atherosclerosis. IMPRESSION: Compression fractures of L2 which could be acute. Marked osteopenia. Aortic Atherosclerosis (ICD10-I70.0). Electronically Signed   By: Lorriane Shire M.D.   On: 10/08/2017 12:43   Dg Hip Unilat With Pelvis 2-3 Views Left  Result Date: 10/08/2017 CLINICAL DATA:  Left hip pain after fall. EXAM: DG HIP (WITH OR WITHOUT PELVIS) 2-3V LEFT; LEFT FEMUR 1 VIEW COMPARISON:  None. FINDINGS: New transcervical left femoral neck fracture.  Prior left femoral intramedullary nail placement. Partially visualized left total knee arthroplasty. Prior cephalomedullary rod fixation of the right proximal femur with healing versus healed lesser trochanteric fracture, possibly stress related. Diffuse osteopenia. The sacroiliac joints and pubic symphysis are intact. IMPRESSION: 1. New transcervical left femoral neck fracture. Electronically Signed   By: Titus Dubin M.D.   On: 10/08/2017 12:38   Dg Femur 1v Left  Result Date: 10/08/2017 CLINICAL DATA:  Left hip pain after fall. EXAM: DG HIP (WITH OR WITHOUT PELVIS) 2-3V LEFT; LEFT FEMUR 1 VIEW COMPARISON:  None. FINDINGS: New transcervical left femoral neck fracture. Prior left femoral intramedullary nail placement. Partially visualized left total knee arthroplasty. Prior cephalomedullary rod fixation of the right proximal femur with healing versus healed lesser trochanteric fracture, possibly stress related. Diffuse osteopenia. The sacroiliac joints and pubic symphysis are intact. IMPRESSION: 1. New transcervical left femoral neck fracture. Electronically Signed   By: Titus Dubin M.D.   On: 10/08/2017 12:38    Procedures Procedures (including critical care time)  Medications Ordered in ED Medications  fentaNYL (SUBLIMAZE) injection 25 mcg (not administered)  levothyroxine (SYNTHROID, LEVOTHROID) tablet 100 mcg (not administered)  loratadine (CLARITIN) tablet 10 mg (not administered)  pantoprazole (PROTONIX) EC tablet 40 mg (not administered)  sodium chloride tablet 1 g (not administered)  senna (SENOKOT) tablet 8.6 mg (not administered)  amiodarone (PACERONE) tablet 100 mg (not administered)  ALPRAZolam (XANAX) tablet 0.5 mg (not administered)  fentaNYL (SUBLIMAZE) injection 25 mcg (25 mcg Intravenous Given 10/08/17 1245)  nitrofurantoin (macrocrystal-monohydrate) (MACROBID) capsule 100 mg (100 mg Oral Given 10/08/17 1402)     Initial Impression / Assessment and Plan / ED Course    I have reviewed the triage vital signs and the nursing notes.  Pertinent labs & imaging results that were available during my care of the patient were reviewed by me and considered in my medical decision making (see chart for details).  Clinical Course as of Oct 08 1605  Wed Oct 08, 2017  1430 Dr. Wynelle Link with orthopedics recommending medical admission and will see patient.   [JR]  1538 Dr. Maryland Pink with [facility] accepting admission.  [JR]  83 Per family, Dr. Wynelle Link scheduling pt to OR tonight.  [JR]    Clinical Course User Index [JR] Runette Scifres, [place] N, PA-C    Presenting status post fall.  Not on anticoagulation.   No head trauma or LOC.  No focal neuro deficits on exam. Left leg with obvious deformity.  X-ray showing left oral neck fracture.  L-spine imaging showing compression fracture at L2, suspect this is from prior injury. Pain managed in ED with fentanyl. Urine culture result from previous visit showing resistance to ciprofloxacin.  Antibiotic changed to [facility] for UTI.  Patient discussed with Dr. Wynelle Link with orthopedics who recommends  medical admission. Dr. Wynelle Link saw patient and plans for ORIF this evening. Spoke with Dr. Maryland Pink with [facility], accepting medical admission.    Patient discussed with and seen by Dr. Wilson Singer.  The patient appears reasonably stabilized for admission considering the current resources, flow, and capabilities available in the ED at this time, and I doubt any other [facility] requiring further screening and/or treatment in the ED prior to admission.  Final Clinical Impressions(s) / ED Diagnoses   Final diagnoses:  Closed fracture of neck of left femur, initial encounter [facility])  Fall, initial encounter    ED Discharge Orders    None       Shya Kovatch, [place] N, PA-C 10/08/17 1606    Virgel Manifold, MD 10/09/17 [phone]

## 2017-10-08 NOTE — Consult Note (Signed)
Reason for Consult:Left femoral neck fracture Referring Physician: Dr. Felix Pacini Raven Snyder is an 83 y.o. female.  HPI: 83 yo female who recently developed a UTI and had been feeling weak secondary to that. She was ambulating today at home and tripped and fell on her left side with immediate left hip pain and inability to get up. She did not hit her head or have a LOC. She denies any chest pain, SOB, syncopal symptoms, palpitations or dizziness associated with or leading to the fall. She was taken to the [facility] and noted to have a femoral neck fracture. She has had a previous retrograde femoral nail in that femur from a periprosthetic femur fracture around a Total Knee. Those were treated at another institution. Earlier this year she had a right intertrochanteric femur fracture treated elsewhere with an IM nail. Her family states that she is ambulatory with a walker and that her mental status is still intact with slight memory loss over the past year. Today she complains of back pain (chronic) and left hip pain.  Past Medical History:  Diagnosis Date  . Anxiety   . colon ca dx'd 1999  . Hypothyroidism   . SVT (supraventricular tachycardia) ([facility])     Past Surgical History:  Procedure Laterality Date  . FRACTURE SURGERY    . THYROIDECTOMY, PARTIAL      No family history on file.  Social History:  reports that  has never smoked. she has never used smokeless tobacco. She reports that she does not drink alcohol or use drugs.  Allergies:  Allergies  Allergen Reactions  . Accupril [Quinapril Hcl]     Per Assisted Living Documents--reaction unknown  . Bactrim [Sulfamethoxazole-Trimethoprim]     Per Assisted Living Documents--reaction unknown  . Clindamycin/Lincomycin     Per Assisted Living Documents--reaction unknown  . Mold Extract [Trichophyton]     Per Assisted Living Documents--reaction unknown  . Morphine And Related     Per Assisted Living Documents--reaction unknown  .  Nsaids Other (See Comments)    Makes interstitial cystis symptoms worse.   Marland Kitchen Penicillins     Per Assisted Living Documents--reaction unknown  . Shellfish Allergy     Per Assisted Living Documents--reaction unknown  . Tolmetin     Per Assisted Living Documents--reaction unknown  . Tramadol Nausea Only    Makes dizzy   . Yeast-Related Products     Per Assisted Living Documents--reaction unknown  . Zantac [Ranitidine Hcl]     Per Assisted Living Documents--reaction unknown  . Doxycycline Rash    Medications: I have reviewed the patient's current medications.  Results for orders placed or performed during the hospital encounter of 10/08/17 (from the past 48 hour(s))  CBC with Differential     Status: Abnormal   Collection Time: 10/08/17 12:30 PM  Result Value Ref Range   WBC 7.5 4.0 - 10.5 K/uL   RBC 4.75 3.87 - 5.11 MIL/uL   Hemoglobin 15.5 (H) 12.0 - 15.0 g/dL   HCT 44.6 36.0 - 46.0 %   MCV 93.9 78.0 - 100.0 fL   MCH 32.6 26.0 - 34.0 pg   MCHC 34.8 30.0 - 36.0 g/dL   RDW 13.1 11.5 - 15.5 %   Platelets 187 150 - 400 K/uL   Neutrophils Relative % 72 %   Neutro Abs 5.4 1.7 - 7.7 K/uL   Lymphocytes Relative 15 %   Lymphs Abs 1.2 0.7 - 4.0 K/uL   Monocytes Relative 13 %  Monocytes Absolute 1.0 0.1 - 1.0 K/uL   Eosinophils Relative 0 %   Eosinophils Absolute 0.0 0.0 - 0.7 K/uL   Basophils Relative 0 %   Basophils Absolute 0.0 0.0 - 0.1 K/uL  Basic metabolic panel     Status: Abnormal   Collection Time: 10/08/17 12:30 PM  Result Value Ref Range   Sodium 134 (L) 135 - 145 mmol/L   Potassium 4.4 3.5 - 5.1 mmol/L   Chloride 97 (L) 101 - 111 mmol/L   CO2 25 22 - 32 mmol/L   Glucose, Bld 121 (H) 65 - 99 mg/dL   BUN 8 6 - 20 mg/dL   Creatinine, Ser 0.71 0.44 - 1.00 mg/dL   Calcium 10.0 8.9 - 10.3 mg/dL   GFR calc non Af Amer >60 >60 mL/min   GFR calc Af Amer >60 >60 mL/min    Comment: (NOTE) The eGFR has been calculated using the CKD EPI equation. This calculation has not  been validated in all clinical situations. eGFR's persistently <60 mL/min signify possible Chronic Kidney Disease.    Anion gap 12 5 - 15    Dg Lumbar Spine Complete  Result Date: 10/08/2017 CLINICAL DATA:  Left hip pain secondary to a fall from a wheelchair today. EXAM: LUMBAR SPINE - COMPLETE 4+ VIEW COMPARISON:  Radiographs of the chest and left ribs dated 10/06/2017 FINDINGS: There are compression fractures of the superior and inferior endplates of L2. Slight retrolisthesis of L2 on L3. There is a prominent Schmorl's node in the superior endplate of L1, probably not acute. There is marked osteopenia. Grade 1 spondylolisthesis at L4-5. No significant disc space narrowing. Aortic atherosclerosis. IMPRESSION: Compression fractures of L2 which could be acute. Marked osteopenia. Aortic Atherosclerosis (ICD10-I70.0). Electronically Signed   By: Lorriane Shire M.D.   On: 10/08/2017 12:43   Dg Hip Unilat With Pelvis 2-3 Views Left  Result Date: 10/08/2017 CLINICAL DATA:  Left hip pain after fall. EXAM: DG HIP (WITH OR WITHOUT PELVIS) 2-3V LEFT; LEFT FEMUR 1 VIEW COMPARISON:  None. FINDINGS: New transcervical left femoral neck fracture. Prior left femoral intramedullary nail placement. Partially visualized left total knee arthroplasty. Prior cephalomedullary rod fixation of the right proximal femur with healing versus healed lesser trochanteric fracture, possibly stress related. Diffuse osteopenia. The sacroiliac joints and pubic symphysis are intact. IMPRESSION: 1. New transcervical left femoral neck fracture. Electronically Signed   By: Titus Dubin M.D.   On: 10/08/2017 12:38   Dg Femur 1v Left  Result Date: 10/08/2017 CLINICAL DATA:  Left hip pain after fall. EXAM: DG HIP (WITH OR WITHOUT PELVIS) 2-3V LEFT; LEFT FEMUR 1 VIEW COMPARISON:  None. FINDINGS: New transcervical left femoral neck fracture. Prior left femoral intramedullary nail placement. Partially visualized left total knee  arthroplasty. Prior cephalomedullary rod fixation of the right proximal femur with healing versus healed lesser trochanteric fracture, possibly stress related. Diffuse osteopenia. The sacroiliac joints and pubic symphysis are intact. IMPRESSION: 1. New transcervical left femoral neck fracture. Electronically Signed   By: Titus Dubin M.D.   On: 10/08/2017 12:38    ROS Blood pressure (!) 158/91, pulse (!) 104, temperature (!) 97.4 F (36.3 C), temperature source Oral, resp. rate 18, SpO2 97 %. Physical Exam  WD, WN female, resting comfortably in bed; easily arousable and able to speak coherently Left lower extremity shortened and externally rotated, EHL/FHL/TA/gastroc intact; sensation intact; foot warm Tender over lateral left hip  X-ray- Basilar left femoral neck fracture with angulation and at least partial displacement;  IM nail in femoral canal up to level of lesser trochanter  Assessment/Plan: Left femoral neck fracture- This is a very complex problem given the hardware already present in her femur coupled with her poor overall condition. Without the hardware the treatment of choice would be an arthroplasty procedure as the fracture is at least partially displaced. In order to perform an arthroplasty the long femoral nail and interlocks would have to be removed through the knee, which is a big procedure in and of itself, and then the hip replacement would still have to be performed in the same setting. This may be too risky for her to undergo and after discussing this with her family they do not want to put her through such an extensive procedure if other options are available.The other option is to reduce the fracture with manipulation and traction and then fix it with cannulated screws under fluoroscopic guidance. This is a less invasive procedure and much less taxing on her system. The downside is that it might not heal and there will be risk for osteonecrosis given that the blood supply was  altered by the fracture. If it heals then this would be the only treatment necessary.. If it does not heal then she would need conversion to a hip replacement with hardware removal. I discussed all of these scenarios with the family and they, as well as the patient, would like to proceed with the hip pinning  Gaynelle Arabian 10/08/2017, 3:43 PM

## 2017-10-08 NOTE — H&P (Signed)
History and Physical  Delayla Snyder WJX:[id number] DOB: [birth year] DOA: 10/08/2017  Referring physician: Virgel Manifold, ER physician  PCP: System, Pcp Not In  Outpatient Specialists: Rinaldo, cardiologist at [facility] Patient coming from: Independent living & is able to ambulate using a walker  Chief Complaint: Fall   HPI: Raven Snyder is a 83 y.o. female with medical history significant for SVT and hypothyroidism who was seen in the emergency room a few days ago for urinary tract infection. At that time she had been having dizziness for a few days. Patient was given Cipro and discharged home. She felt a little bit lightheaded and today got tangled up on trying to turn and using her walker and fell landing on her left side. She was unable to stand. She is brought into the emergency room.    ED Course: In the emergency room, patient found to have a new transcervical left femoral neck fracture. Orthopedics were consulted who planned to take patient to the emergency room later this evening, pending clearance. Hospitalists were called for further evaluation.  Labs done were unremarkable. During hospitalist exam, patient noted to have irregular rhythm. EKG noted atrial fibrillation rate controlled. New finding. Also noted to be slightly hypoxic requiring 1 L of nasal cannula oxygen to keep actions saturations around 93%. Chest x-ray done only noted signs of chronic COPD but no acute findings.  In addition, cultures from previous ER visit a few days ago noted Escherichia coli resistant to Cipro. Patient given a dose of IV Rocephin in the emergency room.  Review of Systems: Patient seen in the emergency room . Pt complains of pain in the left hip nonradiating.  She also complained of some dizziness earlier that had been going on for the last few days. Denied any dysuria.  Pt denies any headaches, vision changes, dysphagia, chest pink, palpitations, shortness of breath, wheeze cough, abdominal pain,  hematuria, dysuria, constipation, diarrhea, focal extremity numbness. .  Review of systems are otherwise negative   Past Medical History:  Diagnosis Date  . Anxiety   . colon ca dx'd 1999  . Hypothyroidism   . SVT (supraventricular tachycardia) (HCC)    Past Surgical History:  Procedure Laterality Date  . FRACTURE SURGERY    . THYROIDECTOMY, PARTIAL      Social History:  reports that  has never smoked. she has never used smokeless tobacco. She reports that she does not drink alcohol or use drugs.  Recently moved to independent living along with her husband who she lives with. Ambulates using a walker   Allergies  Allergen Reactions  . Accupril [Quinapril Hcl]     Per Assisted Living Documents--reaction unknown  . Bactrim [Sulfamethoxazole-Trimethoprim]     Per Assisted Living Documents--reaction unknown  . Clindamycin/Lincomycin     Per Assisted Living Documents--reaction unknown  . Mold Extract [Trichophyton]     Per Assisted Living Documents--reaction unknown  . Morphine And Related     Per Assisted Living Documents--reaction unknown  . Nsaids Other (See Comments)    Makes interstitial cystis symptoms worse.   Marland Kitchen Penicillins     Per Assisted Living Documents--reaction unknown  . Shellfish Allergy     Per Assisted Living Documents--reaction unknown  . Tolmetin     Per Assisted Living Documents--reaction unknown  . Tramadol Nausea Only    Makes dizzy   . Yeast-Related Products     Per Assisted Living Documents--reaction unknown  . Zantac [Ranitidine Hcl]     Per Assisted Living Documents--reaction unknown  .  Doxycycline Rash    Family history: High blood pressure in a few family members  Prior to Admission medications   Medication Sig Start Date End Date Taking? Authorizing Provider  acetaminophen (TYLENOL) 500 MG tablet Take 1,000 mg 2 (two) times daily by mouth.   Yes [provider]  ALPRAZolam (XANAX) 0.5 MG tablet Take 0.5 mg 4 (four) times daily by  mouth.   Yes [provider]  amiodarone (PACERONE) 100 MG tablet Take 100 mg daily by mouth.   Yes [provider]  levothyroxine (SYNTHROID, LEVOTHROID) 100 MCG tablet Take 100 mcg daily before breakfast by mouth.   Yes [provider]  loratadine (CLARITIN) 10 MG tablet Take 10 mg daily as needed by mouth for allergies.   Yes [provider]  omeprazole (PRILOSEC) 20 MG capsule Take 20 mg daily by mouth.   Yes [provider]  phenazopyridine (AZO-STANDARD) 95 MG tablet Take 95 mg 2 (two) times daily as needed by mouth for pain.   Yes [provider]  senna (SENOKOT) 8.6 MG tablet Take 1 tablet daily as needed by mouth for constipation.   Yes [provider]  sodium chloride 1 g tablet Take 1 g 2 (two) times daily by mouth.   Yes [provider]    Physical Exam: BP (!) 168/89 (BP Location: Right Arm)   Pulse (!) 122   Temp (!) 97.4 F (36.3 C) (Oral)   Resp 18   SpO2 93%   General:  Alert and oriented 2, no acute distress  Eyes: Sclera nonicteric, extraocular movements are intact  ENT: Normocephalic and atraumatic, mucous hemorrhage or dry  Neck: Supple, no JVD  Cardiovascular: Irregular rhythm, rate controlled  Respiratory: Decreased breath sounds throughout, no crackles or wheezes  Abdomen: Soft, nontender, nondistended, positive bowel sounds  Skin: Some bruising at area of fall, otherwise no skin breaks, tears or lesions  Musculoskeletal: No clubbing or cyanosis or edema  Psychiatric: Appropriate, no evidence of psychoses  Neurologic: No focal deficits           Labs on Admission:  Basic Metabolic Panel: Recent Labs  Lab 10/08/17 1230  NA 134*  K 4.4  CL 97*  CO2 25  GLUCOSE 121*  BUN 8  CREATININE 0.71  CALCIUM 10.0   Liver Function Tests: No results for input(s): AST, ALT, ALKPHOS, BILITOT, PROT, ALBUMIN in the last 168 hours. No results for input(s): LIPASE, AMYLASE in the last 168  hours. No results for input(s): AMMONIA in the last 168 hours. CBC: Recent Labs  Lab 10/08/17 1230  WBC 7.5  NEUTROABS 5.4  HGB 15.5*  HCT 44.6  MCV 93.9  PLT 187   Cardiac Enzymes: No results for input(s): CKTOTAL, CKMB, CKMBINDEX, TROPONINI in the last 168 hours.  BNP (last 3 results) No results for input(s): BNP in the last 8760 hours.  ProBNP (last 3 results) No results for input(s): PROBNP in the last 8760 hours.  CBG: No results for input(s): GLUCAP in the last 168 hours.  Radiological Exams on Admission: Dg Lumbar Spine Complete  Result Date: 10/08/2017 CLINICAL DATA:  Left hip pain secondary to a fall from a wheelchair today. EXAM: LUMBAR SPINE - COMPLETE 4+ VIEW COMPARISON:  Radiographs of the chest and left ribs dated 10/06/2017 FINDINGS: There are compression fractures of the superior and inferior endplates of L2. Slight retrolisthesis of L2 on L3. There is a prominent Schmorl's node in the superior endplate of L1, probably not acute. There  is marked osteopenia. Grade 1 spondylolisthesis at L4-5. No significant disc space narrowing. Aortic atherosclerosis. IMPRESSION: Compression fractures of L2 which could be acute. Marked osteopenia. Aortic Atherosclerosis (ICD10-I70.0). Electronically Signed   By: Lorriane Shire M.D.   On: 10/08/2017 12:43   Chest Portable 1 View  Result Date: 10/08/2017 CLINICAL DATA:  Preop for left hip fracture.  Fall today. EXAM: PORTABLE CHEST 1 VIEW COMPARISON:  Chest x-ray 10/06/2017. FINDINGS: Heart is enlarged. Changes of COPD are present with chronic interstitial coarsening and hyperinflation. Bibasilar atelectasis or scarring is noted. Atherosclerotic calcifications are present at the aortic arch. Granulomatous changes are noted. IMPRESSION: 1. Mild bibasilar atelectasis or scarring. 2. Chronic changes of COPD. 3. No acute cardiopulmonary disease PICC Electronically Signed   By: San Morelle M.D.   On: 10/08/2017 17:15   Dg Hip  Unilat With Pelvis 2-3 Views Left  Result Date: 10/08/2017 CLINICAL DATA:  Left hip pain after fall. EXAM: DG HIP (WITH OR WITHOUT PELVIS) 2-3V LEFT; LEFT FEMUR 1 VIEW COMPARISON:  None. FINDINGS: New transcervical left femoral neck fracture. Prior left femoral intramedullary nail placement. Partially visualized left total knee arthroplasty. Prior cephalomedullary rod fixation of the right proximal femur with healing versus healed lesser trochanteric fracture, possibly stress related. Diffuse osteopenia. The sacroiliac joints and pubic symphysis are intact. IMPRESSION: 1. New transcervical left femoral neck fracture. Electronically Signed   By: Titus Dubin M.D.   On: 10/08/2017 12:38   Dg Femur 1v Left  Result Date: 10/08/2017 CLINICAL DATA:  Left hip pain after fall. EXAM: DG HIP (WITH OR WITHOUT PELVIS) 2-3V LEFT; LEFT FEMUR 1 VIEW COMPARISON:  None. FINDINGS: New transcervical left femoral neck fracture. Prior left femoral intramedullary nail placement. Partially visualized left total knee arthroplasty. Prior cephalomedullary rod fixation of the right proximal femur with healing versus healed lesser trochanteric fracture, possibly stress related. Diffuse osteopenia. The sacroiliac joints and pubic symphysis are intact. IMPRESSION: 1. New transcervical left femoral neck fracture. Electronically Signed   By: Titus Dubin M.D.   On: 10/08/2017 12:38    EKG: Independently reviewed. Atrial fibrillation, rate controlled   Assessment/Plan Present on Admission: . Hip fracture ([place]) . Hypothyroidism . Acute lower UTI . Anxiety . AF (paroxysmal atrial fibrillation) ([place]) . Acute on chronic respiratory failure with hypoxia ([place]) . SVT (supraventricular tachycardia) (HCC)  Principal Problem:   Hip fracture (HCC) left femoral neck: Cleared for operating room. Patient has good normal functional status so should do well. See below for acute medical issues.   Active Problems:   Hypothyroidism:  Continue Synthroid once by mouth   Acute lower UTI: Resistant Escherichia coli. Patient had been on Cipro for several days without effect. IV Rocephin With first dose in the emergency room. Hopefully will socially help some of her dizziness    Anxiety: Continue scheduled Xanax which she takes 4 times a day. Be cautious about withdrawals.    AF (paroxysmal atrial fibrillation) ([place]) In patient with history of SVT: No previous history reported of atrial fibrillation.  Noted on exam and confirmed by EKG. Chads 2 score of 4. Since she will are anticoagulation post surgery, might be beneficial to try to start her on xarelto or something similar.    Acute on chronic respiratory failure with hypoxia ([place]): Incidentally noted also. Patient not on home oxygen. Requiring about 1 L an option saturations around 92%. Chest x-ray notes some chronic COPD findings, but nothing acute.  We'll check a BNP in the morning. If elevated, will  check echocardiogram   DVT prophylaxis: SCDs until after surgery   Code Status: DO NOT RESUSCITATE as confirmed by patient and daughter   Family Communication: Daughter and daughter-in-law at the bedside   Disposition Plan: Anticipate discharge to skilled nursing in a few days   Consults called: Aluisio, orthopedic surgery   Admission status: Given need for acute in Hospital services past to [facility], admitted as inpatient   Annita Brod MD [facility] Pager [phone]  If 7PM-7AM, please contact night-coverage [email or web address] Password [facility]  10/08/2017, 6:03 PM

## 2017-10-08 NOTE — ED Notes (Signed)
Patient transported to [place] with radiology technician.

## 2017-10-08 NOTE — ED Notes (Signed)
Bed: WA22 Expected date:  Expected time:  Means of arrival:  Comments: EMS/fall/hip pain 

## 2017-10-08 NOTE — Transfer of Care (Signed)
Immediate Anesthesia Transfer of Care Note  Patient: Lumi Milam  Procedure(s) Performed: CANNULATED HIP PINNING (Left )  Patient Location: PACU  Anesthesia Type:General  Level of Consciousness: sedated, patient cooperative and responds to stimulation  Airway & Oxygen Therapy: Patient Spontanous Breathing and Patient connected to face mask oxygen  Post-op Assessment: Report given to RN and Post -op Vital signs reviewed and stable  Post vital signs: Reviewed and stable  Last Vitals:  Vitals:   10/08/17 1617 10/08/17 1857  BP: (!) 168/89 (!) (P) 184/99  Pulse: (!) 122   Resp: 18   Temp:  (P) 36.8 C  SpO2: 93%     Last Pain:  Vitals:   10/08/17 1404  TempSrc:   PainSc: 6          Complications: No apparent anesthesia complications

## 2017-10-08 NOTE — H&P (View-Only) (Signed)
Reason for Consult:Left femoral neck fracture Referring Physician: Dr. Kohut  Raven Snyder is an 83 y.o. female.  HPI: 83 yo female who recently developed a UTI and had been feeling weak secondary to that. She was ambulating today at home and tripped and fell on her left side with immediate left hip pain and inability to get up. She did not hit her head or have a LOC. She denies any chest pain, SOB, syncopal symptoms, palpitations or dizziness associated with or leading to the fall. She was taken to the [facility] and noted to have a femoral neck fracture. She has had a previous retrograde femoral nail in that femur from a periprosthetic femur fracture around a Total Knee. Those were treated at another institution. Earlier this year she had a right intertrochanteric femur fracture treated elsewhere with an IM nail. Her family states that she is ambulatory with a walker and that her mental status is still intact with slight memory loss over the past year. Today she complains of back pain (chronic) and left hip pain.  Past Medical History:  Diagnosis Date  . Anxiety   . colon ca dx'd 1999  . Hypothyroidism   . SVT (supraventricular tachycardia) (HCC)     Past Surgical History:  Procedure Laterality Date  . FRACTURE SURGERY    . THYROIDECTOMY, PARTIAL      No family history on file.  Social History:  reports that  has never smoked. she has never used smokeless tobacco. She reports that she does not drink alcohol or use drugs.  Allergies:  Allergies  Allergen Reactions  . Accupril [Quinapril Hcl]     Per Assisted Living Documents--reaction unknown  . Bactrim [Sulfamethoxazole-Trimethoprim]     Per Assisted Living Documents--reaction unknown  . Clindamycin/Lincomycin     Per Assisted Living Documents--reaction unknown  . Mold Extract [Trichophyton]     Per Assisted Living Documents--reaction unknown  . Morphine And Related     Per Assisted Living Documents--reaction unknown  .  Nsaids Other (See Comments)    Makes interstitial cystis symptoms worse.   . Penicillins     Per Assisted Living Documents--reaction unknown  . Shellfish Allergy     Per Assisted Living Documents--reaction unknown  . Tolmetin     Per Assisted Living Documents--reaction unknown  . Tramadol Nausea Only    Makes dizzy   . Yeast-Related Products     Per Assisted Living Documents--reaction unknown  . Zantac [Ranitidine Hcl]     Per Assisted Living Documents--reaction unknown  . Doxycycline Rash    Medications: I have reviewed the patient's current medications.  Results for orders placed or performed during the hospital encounter of 10/08/17 (from the past 48 hour(s))  CBC with Differential     Status: Abnormal   Collection Time: 10/08/17 12:30 PM  Result Value Ref Range   WBC 7.5 4.0 - 10.5 K/uL   RBC 4.75 3.87 - 5.11 MIL/uL   Hemoglobin 15.5 (H) 12.0 - 15.0 g/dL   HCT 44.6 36.0 - 46.0 %   MCV 93.9 78.0 - 100.0 fL   MCH 32.6 26.0 - 34.0 pg   MCHC 34.8 30.0 - 36.0 g/dL   RDW 13.1 11.5 - 15.5 %   Platelets 187 150 - 400 K/uL   Neutrophils Relative % 72 %   Neutro Abs 5.4 1.7 - 7.7 K/uL   Lymphocytes Relative 15 %   Lymphs Abs 1.2 0.7 - 4.0 K/uL   Monocytes Relative 13 %     Monocytes Absolute 1.0 0.1 - 1.0 K/uL   Eosinophils Relative 0 %   Eosinophils Absolute 0.0 0.0 - 0.7 K/uL   Basophils Relative 0 %   Basophils Absolute 0.0 0.0 - 0.1 K/uL  Basic metabolic panel     Status: Abnormal   Collection Time: 10/08/17 12:30 PM  Result Value Ref Range   Sodium 134 (L) 135 - 145 mmol/L   Potassium 4.4 3.5 - 5.1 mmol/L   Chloride 97 (L) 101 - 111 mmol/L   CO2 25 22 - 32 mmol/L   Glucose, Bld 121 (H) 65 - 99 mg/dL   BUN 8 6 - 20 mg/dL   Creatinine, Ser 0.71 0.44 - 1.00 mg/dL   Calcium 10.0 8.9 - 10.3 mg/dL   GFR calc non Af Amer >60 >60 mL/min   GFR calc Af Amer >60 >60 mL/min    Comment: (NOTE) The eGFR has been calculated using the CKD EPI equation. This calculation has not  been validated in all clinical situations. eGFR's persistently <60 mL/min signify possible Chronic Kidney Disease.    Anion gap 12 5 - 15    Dg Lumbar Spine Complete  Result Date: 10/08/2017 CLINICAL DATA:  Left hip pain secondary to a fall from a wheelchair today. EXAM: LUMBAR SPINE - COMPLETE 4+ VIEW COMPARISON:  Radiographs of the chest and left ribs dated 10/06/2017 FINDINGS: There are compression fractures of the superior and inferior endplates of L2. Slight retrolisthesis of L2 on L3. There is a prominent Schmorl's node in the superior endplate of L1, probably not acute. There is marked osteopenia. Grade 1 spondylolisthesis at L4-5. No significant disc space narrowing. Aortic atherosclerosis. IMPRESSION: Compression fractures of L2 which could be acute. Marked osteopenia. Aortic Atherosclerosis (ICD10-I70.0). Electronically Signed   By: James  Maxwell M.D.   On: 10/08/2017 12:43   Dg Hip Unilat With Pelvis 2-3 Views Left  Result Date: 10/08/2017 CLINICAL DATA:  Left hip pain after fall. EXAM: DG HIP (WITH OR WITHOUT PELVIS) 2-3V LEFT; LEFT FEMUR 1 VIEW COMPARISON:  None. FINDINGS: New transcervical left femoral neck fracture. Prior left femoral intramedullary nail placement. Partially visualized left total knee arthroplasty. Prior cephalomedullary rod fixation of the right proximal femur with healing versus healed lesser trochanteric fracture, possibly stress related. Diffuse osteopenia. The sacroiliac joints and pubic symphysis are intact. IMPRESSION: 1. New transcervical left femoral neck fracture. Electronically Signed   By: William T Derry M.D.   On: 10/08/2017 12:38   Dg Femur 1v Left  Result Date: 10/08/2017 CLINICAL DATA:  Left hip pain after fall. EXAM: DG HIP (WITH OR WITHOUT PELVIS) 2-3V LEFT; LEFT FEMUR 1 VIEW COMPARISON:  None. FINDINGS: New transcervical left femoral neck fracture. Prior left femoral intramedullary nail placement. Partially visualized left total knee  arthroplasty. Prior cephalomedullary rod fixation of the right proximal femur with healing versus healed lesser trochanteric fracture, possibly stress related. Diffuse osteopenia. The sacroiliac joints and pubic symphysis are intact. IMPRESSION: 1. New transcervical left femoral neck fracture. Electronically Signed   By: William T Derry M.D.   On: 10/08/2017 12:38    ROS Blood pressure (!) 158/91, pulse (!) 104, temperature (!) 97.4 F (36.3 C), temperature source Oral, resp. rate 18, SpO2 97 %. Physical Exam  WD, WN female, resting comfortably in bed; easily arousable and able to speak coherently Left lower extremity shortened and externally rotated, EHL/FHL/TA/gastroc intact; sensation intact; foot warm Tender over lateral left hip  X-ray- Basilar left femoral neck fracture with angulation and at least partial displacement;   IM nail in femoral canal up to level of lesser trochanter  Assessment/Plan: Left femoral neck fracture- This is a very complex problem given the hardware already present in her femur coupled with her poor overall condition. Without the hardware the treatment of choice would be an arthroplasty procedure as the fracture is at least partially displaced. In order to perform an arthroplasty the long femoral nail and interlocks would have to be removed through the knee, which is a big procedure in and of itself, and then the hip replacement would still have to be performed in the same setting. This may be too risky for her to undergo and after discussing this with her family they do not want to put her through such an extensive procedure if other options are available.The other option is to reduce the fracture with manipulation and traction and then fix it with cannulated screws under fluoroscopic guidance. This is a less invasive procedure and much less taxing on her system. The downside is that it might not heal and there will be risk for osteonecrosis given that the blood supply was  altered by the fracture. If it heals then this would be the only treatment necessary.. If it does not heal then she would need conversion to a hip replacement with hardware removal. I discussed all of these scenarios with the family and they, as well as the patient, would like to proceed with the hip pinning  Gaynelle Arabian 10/08/2017, 3:43 PM

## 2017-10-09 ENCOUNTER — Telehealth: Payer: Self-pay | Admitting: *Deleted

## 2017-10-09 ENCOUNTER — Inpatient Hospital Stay (HOSPITAL_COMMUNITY): Payer: Medicare Other

## 2017-10-09 ENCOUNTER — Encounter (HOSPITAL_COMMUNITY): Payer: Self-pay | Admitting: Radiology

## 2017-10-09 DIAGNOSIS — W19XXXA Unspecified fall, initial encounter: Secondary | ICD-10-CM

## 2017-10-09 LAB — BASIC METABOLIC PANEL
Anion gap: 11 (ref 5–15)
BUN: 10 mg/dL (ref 6–20)
CHLORIDE: 99 mmol/L — AB (ref 101–111)
CO2: 25 mmol/L (ref 22–32)
CREATININE: 0.49 mg/dL (ref 0.44–1.00)
Calcium: 9 mg/dL (ref 8.9–10.3)
GFR calc Af Amer: >60 mL/min (ref >60)
GFR calc non Af Amer: >60 mL/min (ref >60)
GLUCOSE: 141 mg/dL — AB (ref 65–99)
Potassium: 3.7 mmol/L (ref 3.5–5.1)
Sodium: 135 mmol/L (ref 135–145)

## 2017-10-09 LAB — CBC
HEMATOCRIT: 39.9 % (ref 36.0–46.0)
HEMOGLOBIN: 13.3 g/dL (ref 12.0–15.0)
MCH: 31.9 pg (ref 26.0–34.0)
MCHC: 33.3 g/dL (ref 30.0–36.0)
MCV: 95.7 fL (ref 78.0–100.0)
Platelets: 170 10*3/uL (ref 150–400)
RBC: 4.17 MIL/uL (ref 3.87–5.11)
RDW: 13.3 % (ref 11.5–15.5)
WBC: 7.2 10*3/uL (ref 4.0–10.5)

## 2017-10-09 LAB — BRAIN NATRIURETIC PEPTIDE: B Natriuretic Peptide: 230.6 pg/mL — ABNORMAL HIGH (ref 0.0–100.0)

## 2017-10-09 MED ORDER — IOPAMIDOL (ISOVUE-370) INJECTION 76%
INTRAVENOUS | Status: AC
  Filled 2017-10-09: qty 100

## 2017-10-09 MED ORDER — DEXTROSE 5 % IV SOLN
1.0000 g | INTRAVENOUS | Status: DC
  Administered 2017-10-09: 12:00:00 1 g via INTRAVENOUS
  Filled 2017-10-09 (×2): qty 10

## 2017-10-09 MED ORDER — IOPAMIDOL (ISOVUE-370) INJECTION 76%
100.0000 mL | Freq: Once | INTRAVENOUS | Status: AC | PRN
  Administered 2017-10-09: 12:00:00 100 mL via INTRAVENOUS

## 2017-10-09 MED ORDER — POLYETHYLENE GLYCOL 3350 17 G PO PACK: 17.0000 g | PACK | Freq: Every day | ORAL | Status: DC | PRN

## 2017-10-09 MED ORDER — ENOXAPARIN SODIUM 40 MG/0.4ML ~~LOC~~ SOLN
40.0000 mg | SUBCUTANEOUS | Status: DC
  Administered 2017-10-10 – 2017-10-12 (×3): 40 mg via SUBCUTANEOUS
  Filled 2017-10-09 (×3): qty 0.4

## 2017-10-09 MED ORDER — POLYETHYLENE GLYCOL 3350 17 G PO PACK
17.0000 g | PACK | Freq: Two times a day (BID) | ORAL | Status: DC
  Administered 2017-10-09 – 2017-10-12 (×7): 17 g via ORAL
  Filled 2017-10-09 (×7): qty 1

## 2017-10-09 MED ORDER — LIP MEDEX EX OINT
TOPICAL_OINTMENT | CUTANEOUS | Status: AC
  Administered 2017-10-09: 15:00:00
  Filled 2017-10-09: qty 7

## 2017-10-09 MED ORDER — OXYCODONE HCL 5 MG PO TABS
5.0000 mg | ORAL_TABLET | ORAL | Status: DC | PRN
  Administered 2017-10-09 (×2): 5 mg via ORAL
  Administered 2017-10-09: 10 mg via ORAL
  Administered 2017-10-10 – 2017-10-12 (×3): 5 mg via ORAL
  Filled 2017-10-09: qty 1
  Filled 2017-10-09: qty 2
  Filled 2017-10-09 (×4): qty 1

## 2017-10-09 NOTE — Telephone Encounter (Signed)
Post ED Visit - Positive Culture Follow-up  Culture report reviewed by antimicrobial stewardship pharmacist:  []  Elenor Quinones, Pharm.D. []  Heide Guile, Pharm.D., BCPS AQ-ID []  Parks Neptune, Pharm.D., BCPS []  Alycia Rossetti, Pharm.D., BCPS []  Lee, Pharm.D., BCPS, AAHIVP []  Legrand Como, Pharm.D., BCPS, AAHIVP [x]  Salome Arnt, PharmD, BCPS []  Dimitri Ped, PharmD, BCPS []  Vincenza Hews, PharmD, BCPS  Positive urine culture Treated with Cephalexin and Cipro, organism sensitive to the same and no further patient follow-up is required at this time.  Harlon Flor [facility] 10/09/2017, 2:23 PM

## 2017-10-09 NOTE — Evaluation (Signed)
Physical Therapy Evaluation Patient Details Name: Raven Snyder MRN: [id number] DOB: 01-13-1929 Today's Date: 10/09/2017   History of Present Illness  83yo F with L hip and lower back pain after fall.  Pt seen 2 days PTA in  ED with L chest pain after fall. Underwent CANNULATED HIP PINNING (Left) 10/08/17. PMH significant for previous  L hip fx with IM nail; anxiety; and per daughter early onset dementia.  Clinical Impression  Pt admitted with above diagnosis. Pt currently with functional limitations due to the deficits listed below (see PT Problem List).  Pt will benefit from skilled PT to increase their independence and safety with mobility to allow discharge to the venue listed below.  recommend SNF, will follow in acute setting     Follow Up Recommendations SNF;Supervision/Assistance - 24 hour    Equipment Recommendations  None recommended by PT    Recommendations for Other Services       Precautions / Restrictions Precautions Precautions: Fall Restrictions LLE Weight Bearing: Weight bearing as tolerated      Mobility  Bed Mobility Overal bed mobility: Needs Assistance Bed Mobility: Supine to Sit;Sit to Supine     Supine to sit: HOB elevated;Mod assist Sit to supine: +2 for physical assistance;+2 for safety/equipment   General bed mobility comments: pt is able to scoot laterally with min/mod assist, assist iwth LLE off bed and  to initiate forward trasnlation of trunk; assist with LEs and trunk on return to supine;   Transfers Overall transfer level: Needs assistance Equipment used: Rolling walker (2 wheeled) Transfers: Sit to/from Stand Sit to Stand: Max assist;Mod assist;+2 physical assistance         General transfer comment: incr time to stand, cues for trunk/hip extension, hand placement;   Ambulation/Gait             General Gait Details: lateral steps along EOB with +2 mod assist and RW  Stairs            Wheelchair Mobility    Modified  [place] (Stroke Patients Only)       Balance Overall balance assessment: History of Falls;Needs assistance Sitting-balance support: Feet supported;No upper extremity supported;Feet unsupported Sitting balance-Leahy Scale: Fair       Standing balance-Leahy Scale: Poor Standing balance comment: reliant on RW                             Pertinent Vitals/Pain      Home Living Family/patient expects to be discharged to:: Skilled nursing facility                      Prior Function Level of Independence: Independent with assistive device(s)         Comments: used rollator prior to surgery     Hand Dominance        Extremity/Trunk Assessment   Upper Extremity Assessment Upper Extremity Assessment: Generalized weakness;Defer to OT evaluation    Lower Extremity Assessment Lower Extremity Assessment: Generalized weakness LLE Deficits / Details: knee  and flexion to 60* AAROM, strength grossly 2 to 2+/5 LLE: Unable to fully assess due to pain       Communication   Communication: HOH  Cognition Arousal/Alertness: Awake/alert Behavior During Therapy: WFL for tasks assessed/performed Overall Cognitive Status: History of cognitive impairments - at baseline  General Comments: follows one step commands consistently      General Comments      Exercises     Assessment/Plan    PT Assessment Patient needs continued PT services  PT Problem List Decreased strength;Decreased range of motion;Decreased activity tolerance;Decreased balance;Decreased mobility;Decreased knowledge of use of DME;Decreased knowledge of precautions       PT Treatment Interventions DME instruction;Gait training;Functional mobility training;Therapeutic activities;Therapeutic exercise;Patient/family education    PT Goals (Current goals can be found in the Care Plan section)  Acute Rehab PT Goals Patient Stated Goal: to get better  to go home PT Goal Formulation: With patient/family Potential to Achieve Goals: Good    Frequency Min 3X/week   Barriers to discharge        Co-evaluation               AM-PAC PT "6 Clicks" Daily Activity  Outcome Measure Difficulty turning over in bed (including adjusting bedclothes, sheets and blankets)?: Unable Difficulty moving from lying on back to sitting on the side of the bed? : Unable Difficulty sitting down on and standing up from a chair with arms (e.g., wheelchair, bedside commode, etc,.)?: Unable Help needed moving to and from a bed to chair (including a wheelchair)?: Total Help needed walking in hospital room?: Total Help needed climbing 3-5 steps with a railing? : Total 6 Click Score: 6    End of Session Equipment Utilized During Treatment: Gait belt Activity Tolerance: Patient tolerated treatment well Patient left: in bed;with bed alarm set;with family/visitor present Nurse Communication: Mobility status PT Visit Diagnosis: Difficulty in walking, not elsewhere classified (R26.2);History of falling (Z91.81);Pain Pain - Right/Left: Left Pain - part of body: Hip    Time: 9892-1194 PT Time Calculation (min) (ACUTE ONLY): 23 min   Charges:   PT Evaluation $PT Eval Moderate Complexity: 1 Mod PT Treatments $Therapeutic Activity: 8-22 mins   PT G CodesKenyon Ana, PT Pager: [phone] 10/09/2017   [facility] 10/09/2017, 6:29 PM

## 2017-10-09 NOTE — Progress Notes (Signed)
Subjective: 1 Day Post-Op Procedure(s) (LRB): CANNULATED HIP PINNING (Left) Patient reports pain as mild.    Objective: Vital signs in last 24 hours: Temp:  [97.4 F (36.3 C)-99 F (37.2 C)] 98.9 F (37.2 C) (11/22 0558) Pulse Rate:  [89-122] 112 (11/22 0558) Resp:  [15-20] 17 (11/22 0558) BP: (123-184)/(61-99) 146/71 (11/22 0558) SpO2:  [93 %-97 %] 95 % (11/22 0558) Weight:  [176 lb 12.9 oz (80.2 kg)] 176 lb 12.9 oz (80.2 kg) (11/21 1945)  Intake/Output from previous day: 11/21 0701 - 11/22 0700 In: 1526.3 [P.O.:180; I.V.:1146.3; IV Piggyback:200] Out: 775 [Urine:425; Blood:350] Intake/Output this shift: No intake/output data recorded.  Recent Labs    10/08/17 1230 10/09/17 0624  HGB 15.5* 13.3   Recent Labs    10/08/17 1230 10/09/17 0624  WBC 7.5 7.2  RBC 4.75 4.17  HCT 44.6 39.9  PLT 187 170   Recent Labs    10/08/17 1230 10/09/17 0624  NA 134* 135  K 4.4 3.7  CL 97* 99*  CO2 25 25  BUN 8 10  CREATININE 0.71 0.49  GLUCOSE 121* 141*  CALCIUM 10.0 9.0   No results for input(s): LABPT, INR in the last 72 hours.  Neurologically intact Neurovascular intact Dorsiflexion/Plantar flexion intact No cellulitis present Compartment soft  Assessment/Plan: 1 Day Post-Op Procedure(s) (LRB): CANNULATED HIP PINNING (Left) Advance diet Up with therapy  Social work for SNF placement  [facility] 10/09/2017, 7:27 AM

## 2017-10-09 NOTE — Anesthesia Postprocedure Evaluation (Signed)
Anesthesia Post Note  Patient: Raven Snyder  Procedure(s) Performed: CANNULATED HIP PINNING (Left )     Patient location during evaluation: PACU Anesthesia Type: General Level of consciousness: awake and alert Pain management: pain level controlled Vital Signs Assessment: post-procedure vital signs reviewed and stable Respiratory status: spontaneous breathing, nonlabored ventilation, respiratory function stable and patient connected to nasal cannula oxygen Cardiovascular status: blood pressure returned to baseline and stable Postop Assessment: no apparent nausea or vomiting Anesthetic complications: no    Last Vitals:  Vitals:   10/09/17 1007 10/09/17 1328  BP: (!) 99/59 (!) 119/50  Pulse: (!) 112 94  Resp: 18 18  Temp: 36.5 C 36.8 C  SpO2: 95% 91%    Last Pain:  Vitals:   10/09/17 1810  TempSrc:   PainSc: 4                  Raven Snyder

## 2017-10-09 NOTE — Progress Notes (Signed)
Occupational Therapy Evaluation Patient Details Name: Raven Snyder MRN: [id number] DOB: 1929-10-06 Today's Date: 10/09/2017    History of Present Illness 83yo F with L hip and lower back pain after fall.  Pt seen 2 days PTA in  ED with L chest pain after fall. Underwent CANNULATED HIP PINNING (Left) 10/08/17. PMH significant for R hip fx; anxiety; and per daughter early onset dementia.   Clinical Impression   PTA, pt lived independently @ [place] level in [place] with her husband. Pt had recently moved into [place] from their home in [place]. Currently, pt requires Max A +2 with sit - stand transfers and was unable to ambulate at this time. Pt desats to 89 on 2L with max HR 120. Pt will benefit from rehab at SNF to maximize functional level of independence anad facilitate safe DC home. Daughter present and in agreement with SNF. Will follow acutely to address established goals.     Follow Up Recommendations  SNF;Supervision/Assistance - 24 hour    Equipment Recommendations  Other (comment)(TBA)    Recommendations for Other Services       Precautions / Restrictions Precautions Precautions: Fall Restrictions LLE Weight Bearing: Weight bearing as tolerated      Mobility Bed Mobility Overal bed mobility: Needs Assistance Bed Mobility: Supine to Sit;Sit to Supine     Supine to sit: +2 for physical assistance;Max assist Sit to supine: +2 for physical assistance;Total assist      Transfers Overall transfer level: Needs assistance Equipment used: Rolling walker (2 wheeled) Transfers: Sit to/from Stand Sit to Stand: Max assist;+2 physical assistance              Balance Overall balance assessment: History of Falls;Needs assistance Sitting-balance support: Feet supported Sitting balance-Leahy Scale: Fair       Standing balance-Leahy Scale: Poor Standing balance comment: reliant on RW                           ADL either  performed or assessed with clinical judgement   ADL Overall ADL's : Needs assistance/impaired Eating/Feeding: Set up   Grooming: Set up;Bed level   Upper Body Bathing: Set up;Sitting   Lower Body Bathing: Maximal assistance;Sit to/from stand   Upper Body Dressing : Set up;Supervision/safety;Bed level;Sitting   Lower Body Dressing: Maximal assistance;Sit to/from stand               Functional mobility during ADLs: +2 for physical assistance;Maximal assistance General ADL Comments: Pt unable to take steps at this time.     Vision Baseline Vision/History: Wears glasses       Perception     Praxis      Pertinent Vitals/Pain Pain Assessment: Faces Faces Pain Scale: Hurts even more Pain Location: L hip with mobility Pain Descriptors / Indicators: Discomfort;Grimacing;Sore;Guarding Pain Intervention(s): Limited activity within patient's tolerance;Repositioned     Hand Dominance Right   Extremity/Trunk Assessment Upper Extremity Assessment Upper Extremity Assessment: Generalized weakness   Lower Extremity Assessment Lower Extremity Assessment: Defer to PT evaluation   Cervical / Trunk Assessment Cervical / Trunk Assessment: Kyphotic   Communication Communication Communication: HOH   Cognition Arousal/Alertness: Awake/alert Behavior During Therapy: WFL for tasks assessed/performed Overall Cognitive Status: History of cognitive impairments - at baseline  General Comments       Exercises     Shoulder Instructions      Home Living Family/patient expects to be discharged to:: Skilled nursing facility                                        Prior Functioning/Environment Level of Independence: Independent with assistive device(s)        Comments: used RW        OT Problem List: Decreased strength;Decreased range of motion;Decreased activity tolerance;Impaired balance (sitting and/or  standing);Decreased safety awareness;Decreased knowledge of use of DME or AE;Pain      OT Treatment/Interventions: Self-care/ADL training;Therapeutic exercise;DME and/or AE instruction;Therapeutic activities;Cognitive remediation/compensation;Patient/family education;Balance training    OT Goals(Current goals can be found in the care plan section) Acute Rehab OT Goals Patient Stated Goal: to get better to go home OT Goal Formulation: With patient/family Time For Goal Achievement: 10/23/17 Potential to Achieve Goals: Good  OT Frequency: Min 2X/week   Barriers to D/C:            Co-evaluation              AM-PAC PT "6 Clicks" Daily Activity     Outcome Measure Help from another person eating meals?: None Help from another person taking care of personal grooming?: A Little Help from another person toileting, which includes using toliet, bedpan, or urinal?: Total Help from another person bathing (including washing, rinsing, drying)?: A Lot Help from another person to put on and taking off regular upper body clothing?: A Little Help from another person to put on and taking off regular lower body clothing?: Total 6 Click Score: 14   End of Session Equipment Utilized During Treatment: Gait belt;Rolling walker;Oxygen(2L) Nurse Communication: Mobility status;Weight bearing status;Precautions  Activity Tolerance: Patient tolerated treatment well Patient left: in bed;with call bell/phone within reach;with family/visitor present;Other (comment)(tech takin gpt ot CT)  OT Visit Diagnosis: Other abnormalities of gait and mobility (R26.89);Repeated falls (R29.6);Muscle weakness (generalized) (M62.81);History of falling (Z91.81);Pain Pain - Right/Left: Left Pain - part of body: Hip                Time: 3846-6599 OT Time Calculation (min): 36 min Charges:  OT General Charges $OT Visit: 1 Visit OT Evaluation $OT Eval Moderate Complexity: 1 Mod OT Treatments $Self Care/Home Management  : 8-22 mins G-Codes:     [facility], OT/L  [phone] 10/09/2017  Raven Snyder,Raven Snyder 10/09/2017, 1:13 PM

## 2017-10-09 NOTE — Progress Notes (Signed)
[facility] PROGRESS NOTE    Progress Note  Raven Snyder  HEN:[id number] DOB: 1929/01/17 DOA: 10/08/2017 PCP: System, Pcp Not In     Brief Narrative:   Raven Snyder is an 83 y.o. female past medical history significant for SVT, hypothyroidism who prior to this she came to the ED and she was discharged on ciprofloxacin, since then she relates she's been a little lightheaded and not improving. She relates she got tangled up in her wall: Felt on her left side and she was unable to stand. In the ED she was found to have a transcervical left femur fracture orthopedic surgery was consulted who recommended surgical intervention  Assessment/Plan:   Left displaced femoral neck fracture [name]) Patient has good functional status no active medical issues. She is appropriate for any cardiopulmonary complications surgically. Orthopedic surgery saw the patient who recommended surgical intervention performed on 10/08/2017. Physical therapy evaluation is pending Start MiraLAX by mouth twice a day as she is normally constipated and we has started using narcotics.  Hypothyroidism: Continue Synthroid.  Acute lower UTI Started on empiric IV Rocephin in the emergency room showed a previous urine culture did show Escherichia coli resistance to Cipro. Culture showed Escherichia coli sensitive to Macrobid.  Anxiety Continue Xanax 4 times a day.  Sinus tachycardia Chads 2 score of 4. Discontinue enoxaparin started on Xarelto.  Acute on chronic respiratory failure with hypoxia (HCC) Patient not on home oxygen and chronic about 1 L to keep saturation above 92% chest x-ray shows some COPD findings nothing acute. ProBNP is 230, check a 2-D echo. She has new hypoxia in the setting of sinus tachycardia, I am concerned about a PE we'll get a CT and GL. Also in the differential could be amiodarone pulmonary complications. At this pressure required oxygen to keep her saturations above 90% on  discharge.   DVT prophylaxis: Enoxaparin Family Communication:Daughter Disposition Plan/Barrier to D/C: SNF in 2 days Code Status:     Code Status Orders  (From admission, onward)        Start     Ordered   10/08/17 1647  Do not attempt resuscitation (DNR)  Continuous    Question Answer Comment  In the event of cardiac or respiratory ARREST Do not call a "code blue"   In the event of cardiac or respiratory ARREST Do not perform Intubation, CPR, defibrillation or ACLS   In the event of cardiac or respiratory ARREST Use medication by any route, position, wound care, and other measures to relive pain and suffering. May use oxygen, suction and manual treatment of airway obstruction as needed for comfort.      10/08/17 1646    Code Status History    Date Active Date Inactive Code Status Order ID Comments User Context   This patient has a current code status but no historical code status.        IV Access:    Peripheral IV   Procedures and diagnostic studies:   Dg Lumbar Spine Complete  Result Date: 10/08/2017 CLINICAL DATA:  Left hip pain secondary to a fall from a wheelchair today. EXAM: LUMBAR SPINE - COMPLETE 4+ VIEW COMPARISON:  Radiographs of the chest and left ribs dated 10/06/2017 FINDINGS: There are compression fractures of the superior and inferior endplates of L2. Slight retrolisthesis of L2 on L3. There is a prominent Schmorl's node in the superior endplate of L1, probably not acute. There is marked osteopenia. Grade 1 spondylolisthesis at L4-5. No significant disc space narrowing. Aortic  atherosclerosis. IMPRESSION: Compression fractures of L2 which could be acute. Marked osteopenia. Aortic Atherosclerosis (ICD10-I70.0). Electronically Signed   By: Lorriane Shire M.D.   On: 10/08/2017 12:43   Chest Portable 1 View  Result Date: 10/08/2017 CLINICAL DATA:  Preop for left hip fracture.  Fall today. EXAM: PORTABLE CHEST 1 VIEW COMPARISON:  Chest x-ray 10/06/2017.  FINDINGS: Heart is enlarged. Changes of COPD are present with chronic interstitial coarsening and hyperinflation. Bibasilar atelectasis or scarring is noted. Atherosclerotic calcifications are present at the aortic arch. Granulomatous changes are noted. IMPRESSION: 1. Mild bibasilar atelectasis or scarring. 2. Chronic changes of COPD. 3. No acute cardiopulmonary disease PICC Electronically Signed   By: San Morelle M.D.   On: 10/08/2017 17:15   Dg C-arm 1-60 Min-no Report  Result Date: 10/08/2017 Fluoroscopy was utilized by the requesting physician.  No radiographic interpretation.   Dg Hip Operative Unilat W Or W/o Pelvis Left  Result Date: 10/08/2017 CLINICAL DATA:  Hip fixation EXAM: OPERATIVE LEFT HIP (WITH PELVIS IF PERFORMED) 2 VIEWS TECHNIQUE: Fluoroscopic spot image(s) were submitted for interpretation post-operatively. COMPARISON:  Radiography from yesterday FINDINGS: Three cannulated screws transfix the basicervical femoral neck fracture on the left. Anatomic alignment has been restored. Left femoral nail that is partially visualized. IMPRESSION: Fluoroscopy for left femoral neck fracture ORIF. No unexpected finding. Electronically Signed   By: Monte Fantasia M.D.   On: 10/08/2017 20:23   Dg Hip Unilat With Pelvis 2-3 Views Left  Result Date: 10/08/2017 CLINICAL DATA:  Left hip pain after fall. EXAM: DG HIP (WITH OR WITHOUT PELVIS) 2-3V LEFT; LEFT FEMUR 1 VIEW COMPARISON:  None. FINDINGS: New transcervical left femoral neck fracture. Prior left femoral intramedullary nail placement. Partially visualized left total knee arthroplasty. Prior cephalomedullary rod fixation of the right proximal femur with healing versus healed lesser trochanteric fracture, possibly stress related. Diffuse osteopenia. The sacroiliac joints and pubic symphysis are intact. IMPRESSION: 1. New transcervical left femoral neck fracture. Electronically Signed   By: Titus Dubin M.D.   On: 10/08/2017 12:38    Dg Femur 1v Left  Result Date: 10/08/2017 CLINICAL DATA:  Left hip pain after fall. EXAM: DG HIP (WITH OR WITHOUT PELVIS) 2-3V LEFT; LEFT FEMUR 1 VIEW COMPARISON:  None. FINDINGS: New transcervical left femoral neck fracture. Prior left femoral intramedullary nail placement. Partially visualized left total knee arthroplasty. Prior cephalomedullary rod fixation of the right proximal femur with healing versus healed lesser trochanteric fracture, possibly stress related. Diffuse osteopenia. The sacroiliac joints and pubic symphysis are intact. IMPRESSION: 1. New transcervical left femoral neck fracture. Electronically Signed   By: Titus Dubin M.D.   On: 10/08/2017 12:38     Medical Consultants:    None.  Anti-Infectives:   IV Rocephin  Subjective:    Sabriel Himmelberger he relates her pain is controlled she also relates her last bowel movement was 2 days prior to admission.  Objective:    Vitals:   10/08/17 2218 10/09/17 0250 10/09/17 0558 10/09/17 1007  BP: 126/61 123/65 (!) 146/71 (!) 99/59  Pulse: (!) 108 94 (!) 112 (!) 112  Resp: 16 17 17 18   Temp: (!) 97.5 F (36.4 C) 99 F (37.2 C) 98.9 F (37.2 C) 97.7 F (36.5 C)  TempSrc: Oral Axillary Oral Oral  SpO2: 96% 93% 95% 95%  Weight:      Height:        Intake/Output Summary (Last 24 hours) at 10/09/2017 1019 Last data filed at 10/09/2017 0945 Gross per 24 hour  Intake 1766.25 ml  Output 1075 ml  Net 691.25 ml   Filed Weights   10/08/17 1945  Weight: 80.2 kg (176 lb 12.9 oz)    Exam: General exam: In no acute distress. Respiratory system: Good air movement and clear to auscultation. Cardiovascular system: S1 & S2 heard, RRR. No JVD. Gastrointestinal system: Abdomen is nondistended, soft and nontender.  Central nervous system: Alert and oriented. No focal neurological deficits. Extremities: No pedal edema. Skin: No rashes, lesions or ulcers Psychiatry: Judgement and insight appear normal. Mood & affect  appropriate.    Data Reviewed:    Labs: Basic Metabolic Panel: Recent Labs  Lab 10/08/17 1230 10/09/17 0624  NA 134* 135  K 4.4 3.7  CL 97* 99*  CO2 25 25  GLUCOSE 121* 141*  BUN 8 10  CREATININE 0.71 0.49  CALCIUM 10.0 9.0   GFR Estimated Creatinine Clearance: 45.1 mL/min (by C-G formula based on SCr of 0.49 mg/dL). Liver Function Tests: No results for input(s): AST, ALT, ALKPHOS, BILITOT, PROT, ALBUMIN in the last 168 hours. No results for input(s): LIPASE, AMYLASE in the last 168 hours. No results for input(s): AMMONIA in the last 168 hours. Coagulation profile No results for input(s): INR, PROTIME in the last 168 hours.  CBC: Recent Labs  Lab 10/08/17 1230 10/09/17 0624  WBC 7.5 7.2  NEUTROABS 5.4  --   HGB 15.5* 13.3  HCT 44.6 39.9  MCV 93.9 95.7  PLT 187 170   Cardiac Enzymes: No results for input(s): CKTOTAL, CKMB, CKMBINDEX, TROPONINI in the last 168 hours. BNP (last 3 results) No results for input(s): PROBNP in the last 8760 hours. CBG: No results for input(s): GLUCAP in the last 168 hours. D-Dimer: No results for input(s): DDIMER in the last 72 hours. Hgb A1c: No results for input(s): HGBA1C in the last 72 hours. Lipid Profile: No results for input(s): CHOL, HDL, LDLCALC, TRIG, CHOLHDL, LDLDIRECT in the last 72 hours. Thyroid function studies: No results for input(s): TSH, T4TOTAL, T3FREE, THYROIDAB in the last 72 hours.  Invalid input(s): FREET3 Anemia work up: No results for input(s): VITAMINB12, FOLATE, FERRITIN, TIBC, IRON, RETICCTPCT in the last 72 hours. Sepsis Labs: Recent Labs  Lab 10/08/17 1230 10/09/17 0624  WBC 7.5 7.2   Microbiology Recent Results (from the past 240 hour(s))  Urine culture     Status: Abnormal   Collection Time: 10/06/17  9:11 AM  Result Value Ref Range Status   Specimen Description URINE, CLEAN CATCH  Final   Special Requests NONE  Final   Culture >=100,000 COLONIES/mL ESCHERICHIA COLI (A)  Final    Report Status 10/08/2017 FINAL  Final   Organism ID, Bacteria ESCHERICHIA COLI (A)  Final      Susceptibility   Escherichia coli - MIC*    AMPICILLIN >=32 RESISTANT Resistant     CEFAZOLIN >=64 RESISTANT Resistant     CEFTRIAXONE <=1 SENSITIVE Sensitive     CIPROFLOXACIN >=4 RESISTANT Resistant     GENTAMICIN <=1 SENSITIVE Sensitive     IMIPENEM <=0.25 SENSITIVE Sensitive     NITROFURANTOIN <=16 SENSITIVE Sensitive     TRIMETH/SULFA <=20 SENSITIVE Sensitive     AMPICILLIN/SULBACTAM >=32 RESISTANT Resistant     PIP/TAZO 32 INTERMEDIATE Intermediate     Extended ESBL NEGATIVE Sensitive     * >=100,000 COLONIES/mL ESCHERICHIA COLI     Medications:   . ALPRAZolam  0.5 mg Oral QID  . amiodarone  100 mg Oral Daily  . docusate sodium  100  mg Oral BID  . enoxaparin (LOVENOX) injection  40 mg Subcutaneous Q24H  . levothyroxine  100 mcg Oral QAC breakfast  . pantoprazole  40 mg Oral Daily  . sodium chloride  1 g Oral BID   Continuous Infusions: . sodium chloride 75 mL/hr at 10/08/17 1958  . methocarbamol (ROBAXIN)  IV        LOS: 1 day   [facility] Pager [phone]  *Please refer to [email or web address], password [email or web address] to get updated schedule on who will round on this patient, as hospitalists switch teams weekly. If 7PM-7AM, please contact night-coverage at [email or web address], password [email or web address] for any overnight needs.  10/09/2017, 10:19 AM

## 2017-10-10 ENCOUNTER — Encounter (HOSPITAL_COMMUNITY): Payer: Self-pay | Admitting: Orthopedic Surgery

## 2017-10-10 LAB — BASIC METABOLIC PANEL
ANION GAP: 8 (ref 5–15)
BUN: 11 mg/dL (ref 6–20)
CHLORIDE: 101 mmol/L (ref 101–111)
CO2: 23 mmol/L (ref 22–32)
Calcium: 8.5 mg/dL — ABNORMAL LOW (ref 8.9–10.3)
Creatinine, Ser: 0.52 mg/dL (ref 0.44–1.00)
GFR calc Af Amer: >60 mL/min (ref >60)
GFR calc non Af Amer: >60 mL/min (ref >60)
GLUCOSE: 116 mg/dL — AB (ref 65–99)
Potassium: 3.7 mmol/L (ref 3.5–5.1)
Sodium: 132 mmol/L — ABNORMAL LOW (ref 135–145)

## 2017-10-10 LAB — CBC
HEMATOCRIT: 36.6 % (ref 36.0–46.0)
Hemoglobin: 11.8 g/dL — ABNORMAL LOW (ref 12.0–15.0)
MCH: 31.2 pg (ref 26.0–34.0)
MCHC: 32.2 g/dL (ref 30.0–36.0)
MCV: 96.8 fL (ref 78.0–100.0)
Platelets: 148 10*3/uL — ABNORMAL LOW (ref 150–400)
RBC: 3.78 MIL/uL — AB (ref 3.87–5.11)
RDW: 13.6 % (ref 11.5–15.5)
WBC: 7.4 10*3/uL (ref 4.0–10.5)

## 2017-10-10 MED ORDER — POLYVINYL ALCOHOL 1.4 % OP SOLN
1.0000 [drp] | OPHTHALMIC | Status: DC | PRN
  Filled 2017-10-10: qty 15

## 2017-10-10 MED ORDER — NITROFURANTOIN MONOHYD MACRO 100 MG PO CAPS
100.0000 mg | ORAL_CAPSULE | Freq: Two times a day (BID) | ORAL | Status: DC
  Administered 2017-10-10 – 2017-10-12 (×5): 100 mg via ORAL
  Filled 2017-10-10 (×5): qty 1

## 2017-10-10 NOTE — Clinical Social Work Placement (Signed)
   CLINICAL SOCIAL WORK PLACEMENT  NOTE  Date:  10/10/2017  Patient Details  Name: Raven Snyder MRN: [id number] Date of Birth: February 13, 1929  Clinical Social Work is seeking post-discharge placement for this patient at the [place] level of care (*CSW will initial, date and re-position this form in  chart as items are completed):  Yes   Patient/family provided with [place] Work Department's list of facilities offering this level of care within the geographic area requested by the patient (or if unable, by the patient's family).  Yes   Patient/family informed of their freedom to choose among providers that offer the needed level of care, that participate in Medicare, Medicaid or managed care program needed by the patient, have an available bed and are willing to accept the patient.  Yes   Patient/family informed of [facility]'s ownership interest in [facility] and [facility], as well as of the fact that they are under no obligation to receive care at these facilities.  PASRR submitted to EDS on       PASRR number received on       Existing PASRR number confirmed on 10/10/17     FL2 transmitted to all facilities in geographic area requested by pt/family on       FL2 transmitted to all facilities within larger geographic area on 10/10/17     Patient informed that his/her managed care company has contracts with or will negotiate with certain facilities, including the following:  [facility]         Patient/family informed of bed offers received.  Patient chooses bed at [facility]     Physician recommends and patient chooses bed at      Patient to be transferred to [facility] on 10/11/17.  Patient to be transferred to facility by [facility]     Patient family notified on 10/10/17 of transfer.  Name of family member notified:  Daughter     PHYSICIAN Please sign DNR, Please prepare  prescriptions, Please prepare priority discharge summary, including medications     Additional Comment:    _______________________________________________ Lia Hopping, LCSW 10/10/2017, 2:54 PM

## 2017-10-10 NOTE — Clinical Social Work Note (Signed)
Clinical Social Work Assessment  Patient Details  Name: Raven Snyder MRN: [id number] Date of Birth: [birth year]  Date of referral:  10/10/17               Reason for consult:  Facility Placement                Permission sought to share information with:  Facility Sport and exercise psychologist, Family Supports Permission granted to share information::     Name::        Agency::     Relationship::   Daughter  Contact Information:     Housing/Transportation Living arrangements for the past 2 months:  Apartment Source of Information:  Adult Children Patient Interpreter Needed:  None Criminal Activity/Legal Involvement Pertinent to Current Situation/Hospitalization:  No - Comment as needed Significant Relationships:  Adult Children, Spouse Lives with:  Spouse Do you feel safe going back to the place where you live?  Yes Need for family participation in patient care:  Yes (Comment)(Dependent w/mobility)  Care giving concerns:  Patient fractured left hip, dependent with mobility and needs short rehab stay.  Patient unable to go home with spouse.   Social Worker assessment / plan:  CSW met with patient, pt. Daughter and spouse at bedside. Patient ordering breakfast and referred CSW to talk with daughter. Patient daughter express the patient and spouse recently moved from [place] to [place]. Per daughter the patient likes to be independent "it has resulted in several fractures and surgeries in the past." Patient daughter is familiar with SNF process.  Patient daughter prefers a facility in the area.  FL2 complete. PASSRR complete.  Plan: Assist with discharge to SNF  Employment status:  Retired Forensic scientist:  Medicare PT Recommendations:  [place] / Referral to community resources:  [place]  Patient/Family's Response to care:  Agreeable and Responding well to care.  Appreciative of CSW intervention.   Patient/Family's  Understanding of and Emotional Response to Diagnosis, Current Treatment, and Prognosis:  Patient is agreeable to rehab. She refers to daughter for help and support to assist with her current treatment needs.   Emotional Assessment Appearance:  Appears stated age Attitude/Demeanor/Rapport:    Affect (typically observed):  Calm Orientation:  Oriented to Self, Oriented to Place, Oriented to  Time, Oriented to Situation Alcohol / Substance use:  Not Applicable Psych involvement (Current and /or in the community):  No (Comment)  Discharge Needs  Concerns to be addressed:    Readmission within the last 30 days:  Yes Current discharge risk:  Dependent with Mobility Barriers to Discharge:  Continued Medical Work up   [employer], LCSW 10/10/2017, 9:40 AM

## 2017-10-10 NOTE — Progress Notes (Signed)
OT Cancellation Note  Patient Details Name: Raven Snyder MRN: [id number] DOB: Feb 13, 1929   Cancelled Treatment:    Reason Eval/Treat Not Completed: Other (comment).  Pt back to bed and not moving well today. Will check back another day.  [place] Record 10/10/2017, 2:59 PM  Lesle Chris, OTR/L [phone] 10/10/2017

## 2017-10-10 NOTE — NC FL2 (Signed)
[place] LEVEL OF CARE SCREENING TOOL     IDENTIFICATION  Patient Name: Raven Snyder Birthdate: Mar 12, 1929 Sex: female Admission Date (Current Location): 10/08/2017  [facility] and [place] Number:  Herbalist and Address:  [facility],  [address] [place], [place]      Provider Number: [id number]  Attending Physician Name and Address:  Charlynne Cousins, MD  Relative Name and Phone Number:       Current Level of Care: Hospital Recommended Level of Care: [place] Prior Approval Number:    Date Approved/Denied:   PASRR Number: [id number]  Discharge Plan: SNF    Current Diagnoses: Patient Active Problem List   Diagnosis Date Noted  . Left displaced femoral neck fracture ([facility]) 10/08/2017  . Hypothyroidism 10/08/2017  . Acute lower UTI 10/08/2017  . Anxiety 10/08/2017  . AF (paroxysmal atrial fibrillation) ([place]) 10/08/2017  . Acute on chronic respiratory failure with hypoxia ([place]) 10/08/2017  . SVT (supraventricular tachycardia) ([place]) 10/08/2017    Orientation RESPIRATION BLADDER Height & Weight     Self, Time, Situation, Place  Normal Continent, Indwelling catheter Weight: 176 lb 12.9 oz (80.2 kg) Height:  4\' 10"  (147.3 cm)  BEHAVIORAL SYMPTOMS/MOOD NEUROLOGICAL BOWEL NUTRITION STATUS      Continent Diet(Regular)  AMBULATORY STATUS COMMUNICATION OF NEEDS Skin   Extensive Assist Verbally                         Personal Care Assistance Level of Assistance  Bathing, Feeding, Dressing Bathing Assistance: Limited assistance Feeding assistance: Independent Dressing Assistance: Limited assistance     Functional Limitations Info  Sight, Hearing, Speech Sight Info: Adequate Hearing Info: Impaired Speech Info: Adequate    SPECIAL CARE FACTORS FREQUENCY  PT (By licensed PT), OT (By licensed OT)     PT Frequency: 5X/WEEK OT Frequency: 5X/WEEK            Contractures Contractures Info: Not  present    Additional Factors Info  Code Status, Allergies, Psychotropic Code Status Info: DNR Allergies Info: Accupril Quinapril Hcl, Bactrim Sulfamethoxazole-trimethoprim, Clindamycin/lincomycin, Mold Extract Trichophyton, Morphine And Related, Nsaids, Penicillins, Shellfish Allergy, Tolmetin, Tramadol, Yeast-related Products, Zantac Ranitidine Hcl, Doxycycline Psychotropic Info: Xanax         Current Medications (10/10/2017):  This is the current hospital active medication list Current Facility-Administered Medications  Medication Dose Route Frequency Provider Last Rate Last Dose  . 0.9 %  sodium chloride infusion   Intravenous Continuous Charlynne Cousins, MD 75 mL/hr at 10/09/17 1330    . acetaminophen (TYLENOL) tablet 650 mg  650 mg Oral Q6H PRN Gaynelle Arabian, MD       Or  . acetaminophen (TYLENOL) suppository 650 mg  650 mg Rectal Q6H PRN Aluisio, Pilar Plate, MD      . ALPRAZolam Duanne Moron) tablet 0.5 mg  0.5 mg Oral QID Annita Brod, MD   0.5 mg at 10/09/17 2136  . amiodarone (PACERONE) tablet 100 mg  100 mg Oral Daily Annita Brod, MD   100 mg at 10/09/17 1105  . bisacodyl (DULCOLAX) suppository 10 mg  10 mg Rectal Daily PRN Gaynelle Arabian, MD      . docusate sodium (COLACE) capsule 100 mg  100 mg Oral BID Gaynelle Arabian, MD   100 mg at 10/09/17 2136  . enoxaparin (LOVENOX) injection 40 mg  40 mg Subcutaneous Q24H Charlynne Cousins, MD   40 mg at 10/10/17 0726  .  HYDROcodone-acetaminophen (NORCO/VICODIN) 5-325 MG per tablet 1-2 tablet  1-2 tablet Oral Q6H PRN Annita Brod, MD   1 tablet at 10/10/17 0726  . HYDROmorphone (DILAUDID) injection 0.1 mg  0.1 mg Intravenous Q2H PRN Aluisio, Pilar Plate, MD      . levothyroxine (SYNTHROID, LEVOTHROID) tablet 100 mcg  100 mcg Oral QAC breakfast Annita Brod, MD   100 mcg at 10/10/17 0726  . loratadine (CLARITIN) tablet 10 mg  10 mg Oral Daily PRN Annita Brod, MD      . menthol-cetylpyridinium (CEPACOL) lozenge 3 mg   1 lozenge Oral PRN Gaynelle Arabian, MD       Or  . phenol (CHLORASEPTIC) mouth spray 1 spray  1 spray Mouth/Throat PRN Aluisio, Pilar Plate, MD      . methocarbamol (ROBAXIN) tablet 500 mg  500 mg Oral Q6H PRN Gaynelle Arabian, MD       Or  . methocarbamol (ROBAXIN) 500 mg in dextrose 5 % 50 mL IVPB  500 mg Intravenous Q6H PRN Aluisio, Pilar Plate, MD      . metoCLOPramide (REGLAN) tablet 5-10 mg  5-10 mg Oral Q8H PRN Gaynelle Arabian, MD       Or  . metoCLOPramide (REGLAN) injection 5-10 mg  5-10 mg Intravenous Q8H PRN Aluisio, Pilar Plate, MD      . morphine 4 MG/ML injection 0.52 mg  0.52 mg Intravenous Q2H PRN Annita Brod, MD      . nitrofurantoin (macrocrystal-monohydrate) (MACROBID) capsule 100 mg  100 mg Oral Q12H Charlynne Cousins, MD      . ondansetron [facility]) tablet 4 mg  4 mg Oral Q6H PRN Gaynelle Arabian, MD       Or  . ondansetron (ZOFRAN) injection 4 mg  4 mg Intravenous Q6H PRN Aluisio, Pilar Plate, MD      . oxyCODONE (Oxy IR/ROXICODONE) immediate release tablet 5-10 mg  5-10 mg Oral Q4H PRN Absher, Julieta Bellini, RPH   5 mg at 10/09/17 1859  . pantoprazole (PROTONIX) EC tablet 40 mg  40 mg Oral Daily Annita Brod, MD   40 mg at 10/09/17 1104  . polyethylene glycol (MIRALAX / GLYCOLAX) packet 17 g  17 g Oral BID Charlynne Cousins, MD   17 g at 10/09/17 2136  . senna (SENOKOT) tablet 8.6 mg  1 tablet Oral Daily PRN Annita Brod, MD      . sodium chloride tablet 1 g  1 g Oral BID Annita Brod, MD   1 g at 10/09/17 2136  . sodium phosphate (FLEET) 7-19 GM/118ML enema 1 enema  1 enema Rectal Once PRN Gaynelle Arabian, MD         Discharge Medications: Please see discharge summary for a list of discharge medications.  Relevant Imaging Results:  Relevant Lab Results:   Additional Information SSN:[id number]  Lia Hopping, LCSW

## 2017-10-10 NOTE — Progress Notes (Signed)
[facility] PROGRESS NOTE    Progress Note  Raven Snyder  WFU:[id number] DOB: Sep 18, 1929 DOA: 10/08/2017 PCP: System, Pcp Not In     Brief Narrative:   Raven Snyder is an 83 y.o. female past medical history significant for SVT, hypothyroidism who prior to this she came to the ED and she was discharged on ciprofloxacin, since then she relates she's been a little lightheaded and not improving. She relates she got tangled up in her wall: Felt on her left side and she was unable to stand. In the ED she was found to have a transcervical left femur fracture orthopedic surgery was consulted who recommended surgical intervention  Assessment/Plan:   Left displaced femoral neck fracture [facility]) Patient has good functional status no active medical issues. Orthopedic surgery saw the patient who recommended surgical intervention performed on 10/08/2017. Physical therapy recommended skilled nursing facility. She refused MiraLAX.  Tap water enema and a Dulcolax suppository.  Hypothyroidism: Continue Synthroid.  Acute lower UTI Culture data showed E. coli sensitive to Macrobid.  Discontinue Rocephin and start oral Macrobid.  Anxiety Continue Xanax 4 times a day.  Sinus tachycardia Twelve-lead EKG read it as atrial fibrillation but when she has was having with sinus tachycardia, which is now resolved today.  Acute on chronic respiratory failure with hypoxia (HCC) Patient not on home oxygen and chronic about 1 L to keep saturation above 92% chest x-ray shows some COPD findings nothing acute. ProBNP is 230, 2D echo is pending. CT Angio of the chest showed no PE no acute infiltrates. She is desating every time she involves oxygen.   DVT prophylaxis: Enoxaparin Family Communication:Daughter Disposition Plan/Barrier to D/C: SNF in a.m. Code Status:     Code Status Orders  (From admission, onward)        Start     Ordered   10/08/17 1647  Do not attempt resuscitation (DNR)   Continuous    Question Answer Comment  In the event of cardiac or respiratory ARREST Do not call a "code blue"   In the event of cardiac or respiratory ARREST Do not perform Intubation, CPR, defibrillation or ACLS   In the event of cardiac or respiratory ARREST Use medication by any route, position, wound care, and other measures to relive pain and suffering. May use oxygen, suction and manual treatment of airway obstruction as needed for comfort.      10/08/17 1646    Code Status History    Date Active Date Inactive Code Status Order ID Comments User Context   This patient has a current code status but no historical code status.        IV Access:    Peripheral IV   Procedures and diagnostic studies:   Dg Lumbar Spine Complete  Result Date: 10/08/2017 CLINICAL DATA:  Left hip pain secondary to a fall from a wheelchair today. EXAM: LUMBAR SPINE - COMPLETE 4+ VIEW COMPARISON:  Radiographs of the chest and left ribs dated 10/06/2017 FINDINGS: There are compression fractures of the superior and inferior endplates of L2. Slight retrolisthesis of L2 on L3. There is a prominent Schmorl's node in the superior endplate of L1, probably not acute. There is marked osteopenia. Grade 1 spondylolisthesis at L4-5. No significant disc space narrowing. Aortic atherosclerosis. IMPRESSION: Compression fractures of L2 which could be acute. Marked osteopenia. Aortic Atherosclerosis (ICD10-I70.0). Electronically Signed   By: Lorriane Shire M.D.   On: 10/08/2017 12:43   Ct Angio Chest Pe W Or Wo Contrast  Result Date:  10/09/2017 CLINICAL DATA:  Worsening hypoxemia. One day postop from internal fixation of left hip fracture. EXAM: CT ANGIOGRAPHY CHEST WITH CONTRAST TECHNIQUE: Multidetector CT imaging of the chest was performed using the standard protocol during bolus administration of intravenous contrast. Multiplanar CT image reconstructions and MIPs were obtained to evaluate the vascular anatomy. CONTRAST:   142mL ISOVUE-370 IOPAMIDOL (ISOVUE-370) INJECTION 76% COMPARISON:  Chest radiograph on 10/08/2017 FINDINGS: Cardiovascular: Satisfactory opacification of pulmonary arteries noted, and no pulmonary emboli identified. No evidence of thoracic aortic dissection or aneurysm. Mild cardiomegaly. Aortic and coronary artery atherosclerosis. Mediastinum/Nodes: No masses or pathologically enlarged lymph nodes identified. Lungs/Pleura: Small left pleural effusion. Bibasilar atelectasis, left side greater than right. No evidence of pulmonary consolidation or mass. Central tracheobronchial airways are patent. Upper abdomen: No acute findings. Musculoskeletal: No suspicious bone lesions identified. Review of the MIP images confirms the above findings. IMPRESSION: No evidence of pulmonary embolism. Small left pleural effusion, and bibasilar atelectasis, left side greater than right. Aortic Atherosclerosis (ICD10-I70.0). Coronary artery calcification. Electronically Signed   By: Earle Gell M.D.   On: 10/09/2017 12:08   Chest Portable 1 View  Result Date: 10/08/2017 CLINICAL DATA:  Preop for left hip fracture.  Fall today. EXAM: PORTABLE CHEST 1 VIEW COMPARISON:  Chest x-ray 10/06/2017. FINDINGS: Heart is enlarged. Changes of COPD are present with chronic interstitial coarsening and hyperinflation. Bibasilar atelectasis or scarring is noted. Atherosclerotic calcifications are present at the aortic arch. Granulomatous changes are noted. IMPRESSION: 1. Mild bibasilar atelectasis or scarring. 2. Chronic changes of COPD. 3. No acute cardiopulmonary disease PICC Electronically Signed   By: San Morelle M.D.   On: 10/08/2017 17:15   Dg C-arm 1-60 Min-no Report  Result Date: 10/08/2017 Fluoroscopy was utilized by the requesting physician.  No radiographic interpretation.   Dg Hip Operative Unilat W Or W/o Pelvis Left  Result Date: 10/08/2017 CLINICAL DATA:  Hip fixation EXAM: OPERATIVE LEFT HIP (WITH PELVIS IF  PERFORMED) 2 VIEWS TECHNIQUE: Fluoroscopic spot image(s) were submitted for interpretation post-operatively. COMPARISON:  Radiography from yesterday FINDINGS: Three cannulated screws transfix the basicervical femoral neck fracture on the left. Anatomic alignment has been restored. Left femoral nail that is partially visualized. IMPRESSION: Fluoroscopy for left femoral neck fracture ORIF. No unexpected finding. Electronically Signed   By: Monte Fantasia M.D.   On: 10/08/2017 20:23   Dg Hip Unilat With Pelvis 2-3 Views Left  Result Date: 10/08/2017 CLINICAL DATA:  Left hip pain after fall. EXAM: DG HIP (WITH OR WITHOUT PELVIS) 2-3V LEFT; LEFT FEMUR 1 VIEW COMPARISON:  None. FINDINGS: New transcervical left femoral neck fracture. Prior left femoral intramedullary nail placement. Partially visualized left total knee arthroplasty. Prior cephalomedullary rod fixation of the right proximal femur with healing versus healed lesser trochanteric fracture, possibly stress related. Diffuse osteopenia. The sacroiliac joints and pubic symphysis are intact. IMPRESSION: 1. New transcervical left femoral neck fracture. Electronically Signed   By: Titus Dubin M.D.   On: 10/08/2017 12:38   Dg Femur 1v Left  Result Date: 10/08/2017 CLINICAL DATA:  Left hip pain after fall. EXAM: DG HIP (WITH OR WITHOUT PELVIS) 2-3V LEFT; LEFT FEMUR 1 VIEW COMPARISON:  None. FINDINGS: New transcervical left femoral neck fracture. Prior left femoral intramedullary nail placement. Partially visualized left total knee arthroplasty. Prior cephalomedullary rod fixation of the right proximal femur with healing versus healed lesser trochanteric fracture, possibly stress related. Diffuse osteopenia. The sacroiliac joints and pubic symphysis are intact. IMPRESSION: 1. New transcervical left femoral neck fracture. Electronically  Signed   By: Titus Dubin M.D.   On: 10/08/2017 12:38     Medical Consultants:    None.  Anti-Infectives:    IV Rocephin  Subjective:    Raven Snyder he has not had a bowel movement.  Pain is controlled.  Objective:    Vitals:   10/09/17 1007 10/09/17 1328 10/09/17 2145 10/10/17 0550  BP: (!) 99/59 (!) 119/50 (!) 125/55 (!) 141/92  Pulse: (!) 112 94 100 100  Resp: 18 18 19 17   Temp: 97.7 F (36.5 C) 98.3 F (36.8 C) 97.8 F (36.6 C) 97.6 F (36.4 C)  TempSrc: Oral Oral Oral Oral  SpO2: 95% 91% 94% 96%  Weight:      Height:        Intake/Output Summary (Last 24 hours) at 10/10/2017 0931 Last data filed at 10/10/2017 0600 Gross per 24 hour  Intake 2578.75 ml  Output 850 ml  Net 1728.75 ml   Filed Weights   10/08/17 1945  Weight: 80.2 kg (176 lb 12.9 oz)    Exam: General exam: In no acute distress. Respiratory system: Good air movement and clear to auscultation. Cardiovascular system: S1 & S2 heard, RRR. No JVD. Gastrointestinal system: Abdomen is nondistended, soft and nontender.  Central nervous system: Alert and oriented. No focal neurological deficits. Extremities: No pedal edema. Skin: No rashes, lesions or ulcers Psychiatry: Judgement and insight appear normal. Mood & affect appropriate.    Data Reviewed:    Labs: Basic Metabolic Panel: Recent Labs  Lab 10/08/17 1230 10/09/17 0624 10/10/17 0410  NA 134* 135 132*  K 4.4 3.7 3.7  CL 97* 99* 101  CO2 25 25 23   GLUCOSE 121* 141* 116*  BUN 8 10 11   CREATININE 0.71 0.49 0.52  CALCIUM 10.0 9.0 8.5*   GFR Estimated Creatinine Clearance: 45.1 mL/min (by C-G formula based on SCr of 0.52 mg/dL). Liver Function Tests: No results for input(s): AST, ALT, ALKPHOS, BILITOT, PROT, ALBUMIN in the last 168 hours. No results for input(s): LIPASE, AMYLASE in the last 168 hours. No results for input(s): AMMONIA in the last 168 hours. Coagulation profile No results for input(s): INR, PROTIME in the last 168 hours.  CBC: Recent Labs  Lab 10/08/17 1230 10/09/17 0624 10/10/17 0410  WBC 7.5 7.2 7.4   NEUTROABS 5.4  --   --   HGB 15.5* 13.3 11.8*  HCT 44.6 39.9 36.6  MCV 93.9 95.7 96.8  PLT 187 170 148*   Cardiac Enzymes: No results for input(s): CKTOTAL, CKMB, CKMBINDEX, TROPONINI in the last 168 hours. BNP (last 3 results) No results for input(s): PROBNP in the last 8760 hours. CBG: No results for input(s): GLUCAP in the last 168 hours. D-Dimer: No results for input(s): DDIMER in the last 72 hours. Hgb A1c: No results for input(s): HGBA1C in the last 72 hours. Lipid Profile: No results for input(s): CHOL, HDL, LDLCALC, TRIG, CHOLHDL, LDLDIRECT in the last 72 hours. Thyroid function studies: No results for input(s): TSH, T4TOTAL, T3FREE, THYROIDAB in the last 72 hours.  Invalid input(s): FREET3 Anemia work up: No results for input(s): VITAMINB12, FOLATE, FERRITIN, TIBC, IRON, RETICCTPCT in the last 72 hours. Sepsis Labs: Recent Labs  Lab 10/08/17 1230 10/09/17 0624 10/10/17 0410  WBC 7.5 7.2 7.4   Microbiology Recent Results (from the past 240 hour(s))  Urine culture     Status: Abnormal   Collection Time: 10/06/17  9:11 AM  Result Value Ref Range Status   Specimen Description URINE, CLEAN CATCH  Final   Special Requests NONE  Final   Culture >=100,000 COLONIES/mL ESCHERICHIA COLI (A)  Final   Report Status 10/08/2017 FINAL  Final   Organism ID, Bacteria ESCHERICHIA COLI (A)  Final      Susceptibility   Escherichia coli - MIC*    AMPICILLIN >=32 RESISTANT Resistant     CEFAZOLIN >=64 RESISTANT Resistant     CEFTRIAXONE <=1 SENSITIVE Sensitive     CIPROFLOXACIN >=4 RESISTANT Resistant     GENTAMICIN <=1 SENSITIVE Sensitive     IMIPENEM <=0.25 SENSITIVE Sensitive     NITROFURANTOIN <=16 SENSITIVE Sensitive     TRIMETH/SULFA <=20 SENSITIVE Sensitive     AMPICILLIN/SULBACTAM >=32 RESISTANT Resistant     PIP/TAZO 32 INTERMEDIATE Intermediate     Extended ESBL NEGATIVE Sensitive     * >=100,000 COLONIES/mL ESCHERICHIA COLI     Medications:   . ALPRAZolam   0.5 mg Oral QID  . amiodarone  100 mg Oral Daily  . docusate sodium  100 mg Oral BID  . enoxaparin (LOVENOX) injection  40 mg Subcutaneous Q24H  . levothyroxine  100 mcg Oral QAC breakfast  . pantoprazole  40 mg Oral Daily  . polyethylene glycol  17 g Oral BID  . sodium chloride  1 g Oral BID   Continuous Infusions: . sodium chloride 75 mL/hr at 10/09/17 1330  . cefTRIAXone (ROCEPHIN)  IV Stopped (10/09/17 1251)  . methocarbamol (ROBAXIN)  IV        LOS: 2 days   [facility] Pager [phone]  *Please refer to [email or web address], password [email or web address] to get updated schedule on who will round on this patient, as hospitalists switch teams weekly. If 7PM-7AM, please contact night-coverage at [email or web address], password [email or web address] for any overnight needs.  10/10/2017, 9:31 AM

## 2017-10-10 NOTE — Progress Notes (Signed)
     Subjective: 2 Days Post-Op Procedure(s) (LRB): CANNULATED HIP PINNING (Left)   Patient resting comfortably in bed.  No reported events throughout the night.   Objective:   VITALS:   Vitals:   10/09/17 2145 10/10/17 0550  BP: (!) 125/55 (!) 141/92  Pulse: 100 100  Resp: 19 17  Temp: 97.8 F (36.6 C) 97.6 F (36.4 C)  SpO2: 94% 96%    Dorsiflexion/Plantar flexion intact Incision: dressing C/D/I No cellulitis present Compartment soft  LABS Recent Labs    10/08/17 1230 10/09/17 0624 10/10/17 0410  HGB 15.5* 13.3 11.8*  HCT 44.6 39.9 36.6  WBC 7.5 7.2 7.4  PLT 187 170 148*    Recent Labs    10/08/17 1230 10/09/17 0624 10/10/17 0410  NA 134* 135 132*  K 4.4 3.7 3.7  BUN 8 10 11   CREATININE 0.71 0.49 0.52  GLUCOSE 121* 141* 116*     Assessment/Plan: 2 Days Post-Op Procedure(s) (LRB): CANNULATED HIP PINNING (Left) Up with therapy as able Discharge disposition TBD    [place]. Abram Sax   PAC  10/10/2017, 9:01 AM

## 2017-10-10 NOTE — Progress Notes (Signed)
Physical Therapy Treatment Patient Details Name: Raven Snyder MRN: [id number] DOB: 06-10-1929 Today's Date: 10/10/2017    History of Present Illness 83yo F with L hip and lower back pain after fall.  Pt seen 2 days PTA in  ED with L chest pain after fall. Underwent CANNULATED HIP PINNING (Left) 10/08/17. PMH significant for previous  L hip fx with IM nail; anxiety; and per daughter early onset dementia.    PT Comments    Pt progressing slowly, requiring more assist  Overall today, slightly groggy likely d/t pain meds; will continue to follow  Follow Up Recommendations  SNF;Supervision/Assistance - 24 hour     Equipment Recommendations  None recommended by PT    Recommendations for Other Services       Precautions / Restrictions Precautions Precautions: Fall Restrictions LLE Weight Bearing: Weight bearing as tolerated    Mobility  Bed Mobility Overal bed mobility: Needs Assistance Bed Mobility: Supine to Sit     Supine to sit: Max assist;HOB elevated     General bed mobility comments: assist with trunk and LEs off bed, bed pad used to scoot laterally  Transfers Overall transfer level: Needs assistance Equipment used: Rolling walker (2 wheeled) Transfers: Sit to/from [facility] Sit to Stand: Max assist;Mod assist;+2 physical assistance Stand pivot transfers: Max assist;+2 physical assistance       General transfer comment: incr time to stand, cues for trunk/hip extension, hand placement; stand pivot with +2 max assist (without assisted device)   Ambulation/Gait                 Stairs            Wheelchair Mobility    Modified Rankin (Stroke Patients Only)       Balance                                            Cognition Arousal/Alertness: Awake/alert Behavior During Therapy: WFL for tasks assessed/performed Overall Cognitive Status: History of cognitive impairments - at baseline                                 General Comments: follows one step commands consistently, more groggy than yesterday with mild delays likely d/t meds      Exercises      General Comments        Pertinent Vitals/Pain Pain Assessment: Faces Faces Pain Scale: Hurts little more Pain Location: L hip with mobility Pain Descriptors / Indicators: Discomfort;Grimacing;Sore;Guarding    Home Living Family/patient expects to be discharged to:: Skilled nursing facility                    Prior Function            PT Goals (current goals can now be found in the care plan section) Acute Rehab PT Goals PT Goal Formulation: With patient/family Potential to Achieve Goals: Good Progress towards PT goals: Progressing toward goals    Frequency    Min 3X/week      PT Plan Current plan remains appropriate    Co-evaluation              AM-PAC PT "6 Clicks" Daily Activity  Outcome Measure  Difficulty turning over in bed (including adjusting bedclothes, sheets and blankets)?: Unable Difficulty moving from  lying on back to sitting on the side of the bed? : Unable Difficulty sitting down on and standing up from a chair with arms (e.g., wheelchair, bedside commode, etc,.)?: Unable Help needed moving to and from a bed to chair (including a wheelchair)?: Total Help needed walking in hospital room?: Total Help needed climbing 3-5 steps with a railing? : Total 6 Click Score: 6    End of Session Equipment Utilized During Treatment: Gait belt Activity Tolerance: Patient tolerated treatment well Patient left: in chair;with call bell/phone within reach;with chair alarm set;with family/visitor present Nurse Communication: Mobility status PT Visit Diagnosis: Difficulty in walking, not elsewhere classified (R26.2);History of falling (Z91.81);Pain Pain - Right/Left: Left Pain - part of body: Hip     Time: 1610-9604 PT Time Calculation (min) (ACUTE ONLY): 17 min  Charges:   $Therapeutic Activity: 8-22 mins                    G CodesKenyon Ana, PT Pager: [phone] 10/10/2017    [facility] 10/10/2017, 12:46 PM

## 2017-10-11 ENCOUNTER — Inpatient Hospital Stay (HOSPITAL_COMMUNITY): Payer: Medicare Other

## 2017-10-11 DIAGNOSIS — I361 Nonrheumatic tricuspid (valve) insufficiency: Secondary | ICD-10-CM

## 2017-10-11 LAB — CBC
HCT: 34.4 % — ABNORMAL LOW (ref 36.0–46.0)
Hemoglobin: 11.5 g/dL — ABNORMAL LOW (ref 12.0–15.0)
MCH: 31.6 pg (ref 26.0–34.0)
MCHC: 33.4 g/dL (ref 30.0–36.0)
MCV: 94.5 fL (ref 78.0–100.0)
PLATELETS: 168 10*3/uL (ref 150–400)
RBC: 3.64 MIL/uL — AB (ref 3.87–5.11)
RDW: 13.2 % (ref 11.5–15.5)
WBC: 6.7 10*3/uL (ref 4.0–10.5)

## 2017-10-11 LAB — ECHOCARDIOGRAM COMPLETE
Height: 58 in
WEIGHTICAEL: 2828.94 [oz_av]

## 2017-10-11 MED ORDER — METOPROLOL TARTRATE 25 MG PO TABS
25.0000 mg | ORAL_TABLET | Freq: Two times a day (BID) | ORAL | Status: DC
  Administered 2017-10-11 – 2017-10-12 (×3): 25 mg via ORAL
  Filled 2017-10-11 (×3): qty 1

## 2017-10-11 MED ORDER — HYDROMORPHONE HCL 1 MG/ML IJ SOLN: 0.1000 mg | INTRAMUSCULAR | Status: DC | PRN

## 2017-10-11 MED ORDER — METOPROLOL TARTRATE 12.5 MG HALF TABLET: 12.5000 mg | ORAL_TABLET | Freq: Two times a day (BID) | ORAL | Status: DC

## 2017-10-11 NOTE — Progress Notes (Signed)
  Echocardiogram 2D Echocardiogram has been performed.  Tresa Res 10/11/2017, 3:15 PM

## 2017-10-11 NOTE — Progress Notes (Addendum)
[facility] PROGRESS NOTE    Progress Note  Raven Snyder  BHA:[id number] DOB: Jun 15, 1929 DOA: 10/08/2017 PCP: System, Pcp Not In     Brief Narrative:   Raven Snyder is an 83 y.o. female past medical history significant for SVT, hypothyroidism who prior to this she came to the ED and she was discharged on ciprofloxacin, since then she relates she's been a little lightheaded and not improving. She relates she got tangled up in her wall: Felt on her left side and she was unable to stand. In the ED she was found to have a transcervical left femur fracture orthopedic surgery was consulted who recommended surgical intervention  Assessment/Plan:   Left displaced femoral neck fracture [facility]) Patient has good functional status no active medical issues. Orthopedic surgery saw the patient who recommended surgical intervention performed on 10/08/2017. Physical therapy recommended skilled nursing facility. She refused MiraLAx,  Tap water enema did not have a BM this her 6 day with out a BM. She agreed to tap water enema.  Hypothyroidism: Continue Synthroid.  Acute lower UTI Culture data showed E. coli sensitive to Macrobid.  Discontinue Rocephin and start oral Macrobid.  Anxiety Continue Xanax 4 times a day.  Sinus tachycardia Twelve-lead EKG read it as atrial fibrillation but when she has was having with sinus tachycardia, 2D echo is pending.  Acute on chronic respiratory failure with hypoxia (HCC) Patient not on home oxygen and chronic about 2 L to keep saturation above 92% chest x-ray shows some COPD findings nothing acute. ProBNP is 230, 2D echo is pending. CT Angio of the chest showed no PE no acute infiltrates. She is desating every time they remove the oxygen. Will need PFT's as an outpatient   DVT prophylaxis: Enoxaparin Family Communication:Daughter Disposition Plan/Barrier to D/C: SNF in a.m. Code Status:     Code Status Orders  (From admission, onward)        Start     Ordered   10/08/17 1647  Do not attempt resuscitation (DNR)  Continuous    Question Answer Comment  In the event of cardiac or respiratory ARREST Do not call a "code blue"   In the event of cardiac or respiratory ARREST Do not perform Intubation, CPR, defibrillation or ACLS   In the event of cardiac or respiratory ARREST Use medication by any route, position, wound care, and other measures to relive pain and suffering. May use oxygen, suction and manual treatment of airway obstruction as needed for comfort.      10/08/17 1646    Code Status History    Date Active Date Inactive Code Status Order ID Comments User Context   This patient has a current code status but no historical code status.        IV Access:    Peripheral IV   Procedures and diagnostic studies:   Ct Angio Chest Pe W Or Wo Contrast  Result Date: 10/09/2017 CLINICAL DATA:  Worsening hypoxemia. One day postop from internal fixation of left hip fracture. EXAM: CT ANGIOGRAPHY CHEST WITH CONTRAST TECHNIQUE: Multidetector CT imaging of the chest was performed using the standard protocol during bolus administration of intravenous contrast. Multiplanar CT image reconstructions and MIPs were obtained to evaluate the vascular anatomy. CONTRAST:  141mL ISOVUE-370 IOPAMIDOL (ISOVUE-370) INJECTION 76% COMPARISON:  Chest radiograph on 10/08/2017 FINDINGS: Cardiovascular: Satisfactory opacification of pulmonary arteries noted, and no pulmonary emboli identified. No evidence of thoracic aortic dissection or aneurysm. Mild cardiomegaly. Aortic and coronary artery atherosclerosis. Mediastinum/Nodes: No masses or  pathologically enlarged lymph nodes identified. Lungs/Pleura: Small left pleural effusion. Bibasilar atelectasis, left side greater than right. No evidence of pulmonary consolidation or mass. Central tracheobronchial airways are patent. Upper abdomen: No acute findings. Musculoskeletal: No suspicious bone lesions  identified. Review of the MIP images confirms the above findings. IMPRESSION: No evidence of pulmonary embolism. Small left pleural effusion, and bibasilar atelectasis, left side greater than right. Aortic Atherosclerosis (ICD10-I70.0). Coronary artery calcification. Electronically Signed   By: Earle Gell M.D.   On: 10/09/2017 12:08     Medical Consultants:    None.  Anti-Infectives:   IV Rocephin  Subjective:    Raven Snyder Has not have a BM.  Pain is controlled.  Objective:    Vitals:   10/10/17 1338 10/10/17 1804 10/10/17 2053 10/11/17 0544  BP: (!) 144/78  (!) 146/83 (!) 149/77  Pulse: 99 (!) 109 (!) 110 (!) 114  Resp: 16  19 19   Temp: 98.8 F (37.1 C)  98.8 F (37.1 C) 100.2 F (37.9 C)  TempSrc: Oral  Oral Oral  SpO2: 97% 96% 93% 95%  Weight:      Height:        Intake/Output Summary (Last 24 hours) at 10/11/2017 0937 Last data filed at 10/11/2017 0902 Gross per 24 hour  Intake 1236.66 ml  Output 1500 ml  Net -263.34 ml   Filed Weights   10/08/17 1945  Weight: 80.2 kg (176 lb 12.9 oz)    Exam: General exam: In no acute distress. Respiratory system: Good air movement and clear to auscultation. Cardiovascular system: S1 & S2 heard, RRR. No JVD. Gastrointestinal system: Abdomen is nondistended, soft and nontender.  Central nervous system: Alert and oriented. No focal neurological deficits. Extremities: No pedal edema. Skin: No rashes, lesions or ulcers Psychiatry: Judgement and insight appear normal. Mood & affect appropriate.    Data Reviewed:    Labs: Basic Metabolic Panel: Recent Labs  Lab 10/08/17 1230 10/09/17 0624 10/10/17 0410  NA 134* 135 132*  K 4.4 3.7 3.7  CL 97* 99* 101  CO2 25 25 23   GLUCOSE 121* 141* 116*  BUN 8 10 11   CREATININE 0.71 0.49 0.52  CALCIUM 10.0 9.0 8.5*   GFR Estimated Creatinine Clearance: 45.1 mL/min (by C-G formula based on SCr of 0.52 mg/dL). Liver Function Tests: No results for input(s): AST, ALT,  ALKPHOS, BILITOT, PROT, ALBUMIN in the last 168 hours. No results for input(s): LIPASE, AMYLASE in the last 168 hours. No results for input(s): AMMONIA in the last 168 hours. Coagulation profile No results for input(s): INR, PROTIME in the last 168 hours.  CBC: Recent Labs  Lab 10/08/17 1230 10/09/17 0624 10/10/17 0410 10/11/17 0536  WBC 7.5 7.2 7.4 6.7  NEUTROABS 5.4  --   --   --   HGB 15.5* 13.3 11.8* 11.5*  HCT 44.6 39.9 36.6 34.4*  MCV 93.9 95.7 96.8 94.5  PLT 187 170 148* 168   Cardiac Enzymes: No results for input(s): CKTOTAL, CKMB, CKMBINDEX, TROPONINI in the last 168 hours. BNP (last 3 results) No results for input(s): PROBNP in the last 8760 hours. CBG: No results for input(s): GLUCAP in the last 168 hours. D-Dimer: No results for input(s): DDIMER in the last 72 hours. Hgb A1c: No results for input(s): HGBA1C in the last 72 hours. Lipid Profile: No results for input(s): CHOL, HDL, LDLCALC, TRIG, CHOLHDL, LDLDIRECT in the last 72 hours. Thyroid function studies: No results for input(s): TSH, T4TOTAL, T3FREE, THYROIDAB in the last 72  hours.  Invalid input(s): FREET3 Anemia work up: No results for input(s): VITAMINB12, FOLATE, FERRITIN, TIBC, IRON, RETICCTPCT in the last 72 hours. Sepsis Labs: Recent Labs  Lab 10/08/17 1230 10/09/17 0624 10/10/17 0410 10/11/17 0536  WBC 7.5 7.2 7.4 6.7   Microbiology Recent Results (from the past 240 hour(s))  Urine culture     Status: Abnormal   Collection Time: 10/06/17  9:11 AM  Result Value Ref Range Status   Specimen Description URINE, CLEAN CATCH  Final   Special Requests NONE  Final   Culture >=100,000 COLONIES/mL ESCHERICHIA COLI (A)  Final   Report Status 10/08/2017 FINAL  Final   Organism ID, Bacteria ESCHERICHIA COLI (A)  Final      Susceptibility   Escherichia coli - MIC*    AMPICILLIN >=32 RESISTANT Resistant     CEFAZOLIN >=64 RESISTANT Resistant     CEFTRIAXONE <=1 SENSITIVE Sensitive      CIPROFLOXACIN >=4 RESISTANT Resistant     GENTAMICIN <=1 SENSITIVE Sensitive     IMIPENEM <=0.25 SENSITIVE Sensitive     NITROFURANTOIN <=16 SENSITIVE Sensitive     TRIMETH/SULFA <=20 SENSITIVE Sensitive     AMPICILLIN/SULBACTAM >=32 RESISTANT Resistant     PIP/TAZO 32 INTERMEDIATE Intermediate     Extended ESBL NEGATIVE Sensitive     * >=100,000 COLONIES/mL ESCHERICHIA COLI     Medications:   . ALPRAZolam  0.5 mg Oral QID  . amiodarone  100 mg Oral Daily  . docusate sodium  100 mg Oral BID  . enoxaparin (LOVENOX) injection  40 mg Subcutaneous Q24H  . levothyroxine  100 mcg Oral QAC breakfast  . metoprolol tartrate  12.5 mg Oral BID  . nitrofurantoin (macrocrystal-monohydrate)  100 mg Oral Q12H  . pantoprazole  40 mg Oral Daily  . polyethylene glycol  17 g Oral BID  . sodium chloride  1 g Oral BID   Continuous Infusions: . sodium chloride 10 mL/hr at 10/10/17 0946  . methocarbamol (ROBAXIN)  IV        LOS: 3 days   [facility] Pager [phone]  *Please refer to [email or web address], password [email or web address] to get updated schedule on who will round on this patient, as hospitalists switch teams weekly. If 7PM-7AM, please contact night-coverage at [email or web address], password [email or web address] for any overnight needs.  10/11/2017, 9:37 AM

## 2017-10-11 NOTE — Clinical Social Work Note (Addendum)
CSW notified by MD of planned d/c to [facility] today- however- patient still needs an Echo which cannot be scheduled until after 3 pm today.  Patient also has not had a BM.  MD and RN discussed along with patient's daughter Raven Snyder. Plan is for d/c tomorrow to [facility]. Notified Rosendo Gros, Admissions at [facility] of above. Patient's daughter Raven Snyder indicated that she has already signed admission paperwork with facility.    Lorie Phenix. Pauline Good, [place] (weekend coverage)

## 2017-10-11 NOTE — Progress Notes (Signed)
Pt urinated large amount on bed pad along with small amount BM.

## 2017-10-11 NOTE — Progress Notes (Signed)
Subjective: 3 Days Post-Op Procedure(s) (LRB): CANNULATED HIP PINNING (Left) Patient reports pain as minimal.  Tolerating PO's. Limited progress with PT. Resting well in bed. Denies SOB,CP,or calf pain.  Objective: Vital signs in last 24 hours: Temp:  [98.8 F (37.1 C)-100.2 F (37.9 C)] 100.2 F (37.9 C) (11/24 0544) Pulse Rate:  [99-114] 114 (11/24 0544) Resp:  [16-19] 19 (11/24 0544) BP: (144-149)/(77-83) 149/77 (11/24 0544) SpO2:  [93 %-97 %] 95 % (11/24 0544)  Intake/Output from previous day: 11/23 0701 - 11/24 0700 In: 1236.7 [P.O.:690; I.V.:546.7] Out: 1300 [Urine:1300] Intake/Output this shift: No intake/output data recorded.  Recent Labs    10/08/17 1230 10/09/17 0624 10/10/17 0410 10/11/17 0536  HGB 15.5* 13.3 11.8* 11.5*   Recent Labs    10/10/17 0410 10/11/17 0536  WBC 7.4 6.7  RBC 3.78* 3.64*  HCT 36.6 34.4*  PLT 148* 168   Recent Labs    10/09/17 0624 10/10/17 0410  NA 135 132*  K 3.7 3.7  CL 99* 101  CO2 25 23  BUN 10 11  CREATININE 0.49 0.52  GLUCOSE 141* 116*  CALCIUM 9.0 8.5*   No results for input(s): LABPT, INR in the last 72 hours.  Alert and oriented x3. RRR, Lungs clear, BS x4. Left Calf soft and non tender. L hip dressing C/D/I. No DVT signs. No signs of infection or compartment syndrome. LLE grossly neurovascularly intact.   Assessment/Plan: 3 Days Post-Op Procedure(s) (LRB): CANNULATED HIP PINNING (Left) UP with PT Pain management SCD's D/c to to be determined probably SNF   Raven Snyder L 10/11/2017, 8:26 AM

## 2017-10-12 DIAGNOSIS — K5900 Constipation, unspecified: Secondary | ICD-10-CM

## 2017-10-12 MED ORDER — HYDROCODONE-ACETAMINOPHEN 5-325 MG PO TABS: 1.0000 | ORAL_TABLET | Freq: Four times a day (QID) | ORAL | 0 refills | Status: DC | PRN

## 2017-10-12 MED ORDER — POTASSIUM CHLORIDE CRYS ER 20 MEQ PO TBCR
40.0000 meq | EXTENDED_RELEASE_TABLET | Freq: Two times a day (BID) | ORAL | Status: DC
  Administered 2017-10-12: 11:00:00 40 meq via ORAL
  Filled 2017-10-12: qty 2

## 2017-10-12 MED ORDER — FUROSEMIDE 10 MG/ML IJ SOLN
20.0000 mg | Freq: Once | INTRAMUSCULAR | Status: AC
  Administered 2017-10-12: 11:00:00 20 mg via INTRAVENOUS

## 2017-10-12 MED ORDER — FUROSEMIDE 10 MG/ML IJ SOLN
40.0000 mg | Freq: Once | INTRAMUSCULAR | Status: DC
  Filled 2017-10-12: qty 4

## 2017-10-12 MED ORDER — METOPROLOL TARTRATE 25 MG PO TABS: 25.0000 mg | ORAL_TABLET | Freq: Two times a day (BID) | ORAL | 3 refills | Status: DC

## 2017-10-12 MED ORDER — NITROFURANTOIN MONOHYD MACRO 100 MG PO CAPS: 100.0000 mg | ORAL_CAPSULE | Freq: Two times a day (BID) | ORAL | Status: DC

## 2017-10-12 MED ORDER — ALPRAZOLAM 0.5 MG PO TABS: 0.5000 mg | ORAL_TABLET | Freq: Four times a day (QID) | ORAL | 0 refills | Status: DC

## 2017-10-12 MED ORDER — POLYETHYLENE GLYCOL 3350 17 G PO PACK: 17.0000 g | PACK | Freq: Two times a day (BID) | ORAL | 0 refills | Status: DC

## 2017-10-12 MED ORDER — FUROSEMIDE 20 MG PO TABS
20.0000 mg | ORAL_TABLET | Freq: Every day | ORAL | Status: DC
  Administered 2017-10-12: 20 mg via ORAL
  Filled 2017-10-12: qty 1

## 2017-10-12 MED ORDER — FUROSEMIDE 20 MG PO TABS: 20.0000 mg | ORAL_TABLET | Freq: Every day | ORAL | 0 refills | Status: AC

## 2017-10-12 MED ORDER — DOCUSATE SODIUM 100 MG PO CAPS: 100.0000 mg | ORAL_CAPSULE | Freq: Two times a day (BID) | ORAL | 0 refills | Status: DC

## 2017-10-12 NOTE — Progress Notes (Addendum)
11:17- Received call back from facility- prepared for pt to admit today. Report [phone] Room #[id number] Updated pt's daughter Arranged transport- 12pm  Pt discharged and plans to admit to [facility]. CSW spoke with pt's daughter- left voicemail with facility admissions to confirm they are prepared for pt to admit today. Sent DC summary in the [facility]. Upon returned call will arrange transportation  Sharren Bridge, MSW, [place] Work 10/12/2017 [phone] Weekend coverage

## 2017-10-12 NOTE — Discharge Summary (Signed)
Physician Discharge Summary  Raven Snyder ELF:[id number] DOB: 02-Sep-1929 DOA: 10/08/2017  PCP: System, Pcp Not In  Admit date: 10/08/2017 Discharge date: 10/12/2017  Admitted From: home Disposition:  SNF  Recommendations for Outpatient Follow-up:  1. Follow up with Dr. Maureen Ralphs in 1-2 weeks. 2. Will need to follow-up with PCP in 2-4 weeks check labs as below. 3. If blood pressure can tolerate it we will add low-dose ACE inhibitor.  She has been started on low-dose Lasix. 4. Please obtain BMP/CBC in one week 5. She will go to skilled nursing facility.  Filed Weights   10/08/17 1945  Weight: 80.2 kg (176 lb 12.9 oz)    Home Health:NO Equipment/Devices:None  Discharge Condition:stable CODE STATUS:DNR Diet recommendation: Heart Healthy   Brief/Interim Summary: 83 y.o. female past medical history significant for SVT, hypothyroidism who prior to this she came to the ED and she was discharged on ciprofloxacin, since then she relates she's been a little lightheaded and not improving. She relates she got tangled up in her wall: Felt on her left side and she was unable to stand.     Discharge Diagnoses:  Principal Problem:   Left displaced femoral neck fracture (HCC) Active Problems:   Hypothyroidism   Acute lower UTI   Anxiety   AF (paroxysmal atrial fibrillation) (HCC)   Acute on chronic respiratory failure with hypoxia (HCC)   SVT (supraventricular tachycardia) (HCC)  Left displaced femoral fracture: Patient had a good functional status with no active medical condition conditions prior to fracture. An x-ray was done that showed a left displaced femoral fracture, orthopedic surgery was consulted who recommended surgical intervention which was performed on 10/08/2017. Physical therapy evaluated the patient the recommended skilled nursing facility.  Her pain had to be controlled with narcotics which contributed to constipation see below.  Hypothyroidism: No changes were made  continue Synthroid.  Acute lower urinary tract infection: Her prior culture data showed E. coli sensitive to Macrobid.  On admission she was started on IV Rocephin when she was able to take p.o. she was changed to oral Macrobid which she will continue as an outpatient.  Anxiety: No changes were made.  Sinus tachycardia: He had a history of SVT on amiodarone, she was started on low-dose metoprolol and her tachycardia improved a 2D echo was done that showed an EF of 45% with grade 1 diastolic heart failure.    Acute on chronic respiratory failure with hypoxia: She was on no oxygen prior to admission she was placed here and 2 L and her saturations remain above 92% chest x-ray was done that showed no fluid some COPD findings, a BNP was done and it was 230 a 2D echo was done that showed an ejection fraction of 45% with a grade 1 diastolic heart failure. She was started on low-dose beta-blocker and Lasix. A CT angios of the chest was done that showed no infiltrates and no PE. He will need to follow-up with primary care as an outpatient check her weight and a basic metabolic panel in a week. Her estimated dry weight is about 80 kg    Discharge Instructions   Allergies as of 10/12/2017      Reactions   Accupril [quinapril Hcl]    Per Assisted Living Documents--reaction unknown   Bactrim [sulfamethoxazole-trimethoprim]    Per Assisted Living Documents--reaction unknown   Clindamycin/lincomycin    Per Assisted Living Documents--reaction unknown   Mold Extract [trichophyton]    Per Assisted Living Documents--reaction unknown   Morphine And Related  Per Assisted Living Documents--reaction unknown   Nsaids Other (See Comments)   Makes interstitial cystis symptoms worse.    Penicillins    Per Assisted Living Documents--reaction unknown   Shellfish Allergy    Per Assisted Living Documents--reaction unknown   Tolmetin    Per Assisted Living Documents--reaction unknown   Tramadol Nausea  Only   Makes dizzy    Yeast-related Products    Per Assisted Living Documents--reaction unknown   Zantac [ranitidine Hcl]    Per Assisted Living Documents--reaction unknown   Doxycycline Rash      Medication List    TAKE these medications   acetaminophen 500 MG tablet Commonly known as:  TYLENOL Take 1,000 mg 2 (two) times daily by mouth.   ALPRAZolam 0.5 MG tablet Commonly known as:  XANAX Take 0.5 mg 4 (four) times daily by mouth.   amiodarone 100 MG tablet Commonly known as:  PACERONE Take 100 mg daily by mouth.   AZO-STANDARD 95 MG tablet Generic drug:  phenazopyridine Take 95 mg 2 (two) times daily as needed by mouth for pain.   docusate sodium 100 MG capsule Commonly known as:  COLACE Take 1 capsule (100 mg total) by mouth 2 (two) times daily.   furosemide 20 MG tablet Commonly known as:  LASIX Take 1 tablet (20 mg total) by mouth daily.   HYDROcodone-acetaminophen 5-325 MG tablet Commonly known as:  NORCO/VICODIN Take 1-2 tablets by mouth every 6 (six) hours as needed for moderate pain.   levothyroxine 100 MCG tablet Commonly known as:  SYNTHROID, LEVOTHROID Take 100 mcg daily before breakfast by mouth.   loratadine 10 MG tablet Commonly known as:  CLARITIN Take 10 mg daily as needed by mouth for allergies.   metoprolol tartrate 25 MG tablet Commonly known as:  LOPRESSOR Take 1 tablet (25 mg total) by mouth 2 (two) times daily.   nitrofurantoin (macrocrystal-monohydrate) 100 MG capsule Commonly known as:  MACROBID Take 1 capsule (100 mg total) by mouth every 12 (twelve) hours.   omeprazole 20 MG capsule Commonly known as:  PRILOSEC Take 20 mg daily by mouth.   polyethylene glycol packet Commonly known as:  MIRALAX / GLYCOLAX Take 17 g by mouth 2 (two) times daily.   SENOKOT 8.6 MG tablet Generic drug:  senna Take 1 tablet daily as needed by mouth for constipation.   sodium chloride 1 g tablet Take 1 g 2 (two) times daily by mouth.        Allergies  Allergen Reactions  . Accupril [Quinapril Hcl]     Per Assisted Living Documents--reaction unknown  . Bactrim [Sulfamethoxazole-Trimethoprim]     Per Assisted Living Documents--reaction unknown  . Clindamycin/Lincomycin     Per Assisted Living Documents--reaction unknown  . Mold Extract [Trichophyton]     Per Assisted Living Documents--reaction unknown  . Morphine And Related     Per Assisted Living Documents--reaction unknown  . Nsaids Other (See Comments)    Makes interstitial cystis symptoms worse.   Marland Kitchen Penicillins     Per Assisted Living Documents--reaction unknown  . Shellfish Allergy     Per Assisted Living Documents--reaction unknown  . Tolmetin     Per Assisted Living Documents--reaction unknown  . Tramadol Nausea Only    Makes dizzy   . Yeast-Related Products     Per Assisted Living Documents--reaction unknown  . Zantac [Ranitidine Hcl]     Per Assisted Living Documents--reaction unknown  . Doxycycline Rash    Consultations:  Orthopedic surgery Dr. Maureen Ralphs  Procedures/Studies: Dg Ribs Unilateral W/chest Left  Result Date: 10/06/2017 CLINICAL DATA:  Pain following recent fall EXAM: LEFT RIBS AND CHEST - 3+ VIEW COMPARISON:  None. FINDINGS: Frontal chest as well as oblique and cone-down rib images obtained. There is bibasilar atelectatic change. No edema or consolidation. Heart is mildly enlarged. There are calcified lymph nodes throughout the right hilum consistent with prior granulomatous disease. There is no evident pleural effusion or pneumothorax. No evident rib fracture. There is degenerative change in each shoulder. There is aortic atherosclerosis. IMPRESSION: No rib fracture demonstrable. No pneumothorax. There is evidence of prior granulomatous disease. There is bibasilar atelectasis. No consolidation. There is aortic atherosclerosis. Aortic Atherosclerosis (ICD10-I70.0). Electronically Signed   By: Lowella Grip III M.D.   On: 10/06/2017  08:13   Dg Lumbar Spine Complete  Result Date: 10/08/2017 CLINICAL DATA:  Left hip pain secondary to a fall from a wheelchair today. EXAM: LUMBAR SPINE - COMPLETE 4+ VIEW COMPARISON:  Radiographs of the chest and left ribs dated 10/06/2017 FINDINGS: There are compression fractures of the superior and inferior endplates of L2. Slight retrolisthesis of L2 on L3. There is a prominent Schmorl's node in the superior endplate of L1, probably not acute. There is marked osteopenia. Grade 1 spondylolisthesis at L4-5. No significant disc space narrowing. Aortic atherosclerosis. IMPRESSION: Compression fractures of L2 which could be acute. Marked osteopenia. Aortic Atherosclerosis (ICD10-I70.0). Electronically Signed   By: Lorriane Shire M.D.   On: 10/08/2017 12:43   Ct Head Wo Contrast  Result Date: 10/06/2017 CLINICAL DATA:  83 year old female status post fall 2 days ago, struck back of head on furniture. Fall again at 0430 hours today. Dizziness, confusion, posterior headache and neck pain. EXAM: CT HEAD WITHOUT CONTRAST CT CERVICAL SPINE WITHOUT CONTRAST TECHNIQUE: Multidetector CT imaging of the head and cervical spine was performed following the standard protocol without intravenous contrast. Multiplanar CT image reconstructions of the cervical spine were also generated. COMPARISON:  Chest and rib radiographs 0758 hours today. FINDINGS: CT HEAD FINDINGS Brain: No midline shift, mass effect, or evidence of intracranial mass lesion. No ventriculomegaly. No acute intracranial hemorrhage identified. Patchy and confluent bilateral cerebral white matter hypodensity. Deep white matter capsule involvement. Age indeterminate heterogeneity in the left thalamus. No cerebral cortical encephalomalacia but probable chronic lacunar infarct in the right cerebellum on series 3, image 7. No cortically based acute infarct identified. Vascular: Calcified atherosclerosis at the skull base. No suspicious intracranial vascular  hyperdensity. Skull: Osteopenia with prominent skull vascular channels (normal variant). No skull fracture identified. Sinuses/Orbits: Clear.  Tympanic cavities are clear. Other: No scalp hematoma identified. Normal for age orbits soft tissues. CT CERVICAL SPINE FINDINGS Alignment: Exaggerated upper cervical lordosis. Straightening of the lower cervical spine with 1-2 mm of anterolisthesis of C4 on C5 and C5 on C6. Bilateral posterior element alignment is within normal limits. Cervicothoracic junction alignment is within normal limits. Skull base and vertebrae: Osteopenia. Visualized skull base is intact. No atlanto-occipital dissociation. No cervical spine fracture identified. Soft tissues and spinal canal: No prevertebral fluid or swelling. No visible canal hematoma. Calcified carotid atherosclerosis. Otherwise negative noncontrast neck soft tissues. Disc levels: Widespread degenerative cervical facet hypertrophy, moderate to severe in the upper cervical spine. Disc space loss with endplate spurring at T1-X7. Bulky and partially calcified degenerative ligamentous hypertrophy about the odontoid. Anterior C1-odontoid joint space loss with subchondral cysts. No cervical spinal stenosis suspected. Upper chest: Mild T3 superior endplate compression with 20% loss of vertebral body height. No retropulsion. T3  posterior elements appear intact. Questionable mild T4 superior endplate compression. Other visible upper thoracic levels appear intact. Small calcified granuloma in the right lung apex. Negative noncontrast thoracic inlet. IMPRESSION: 1. No acute head trauma identified. 2. Age indeterminate small vessel disease in the cerebral white matter, left thalamus, and right cerebellum. 3. Age indeterminate mild T3 superior endplate compression fracture. Thoracic MRI or Nuclear Medicine Whole-body Bone Scan would best determine acuity. 4. No acute fracture or listhesis in the cervical spine. Multilevel degenerative appearing  spondylolisthesis. Electronically Signed   By: Genevie Ann M.D.   On: 10/06/2017 08:57   Ct Angio Chest Pe W Or Wo Contrast  Result Date: 10/09/2017 CLINICAL DATA:  Worsening hypoxemia. One day postop from internal fixation of left hip fracture. EXAM: CT ANGIOGRAPHY CHEST WITH CONTRAST TECHNIQUE: Multidetector CT imaging of the chest was performed using the standard protocol during bolus administration of intravenous contrast. Multiplanar CT image reconstructions and MIPs were obtained to evaluate the vascular anatomy. CONTRAST:  119mL ISOVUE-370 IOPAMIDOL (ISOVUE-370) INJECTION 76% COMPARISON:  Chest radiograph on 10/08/2017 FINDINGS: Cardiovascular: Satisfactory opacification of pulmonary arteries noted, and no pulmonary emboli identified. No evidence of thoracic aortic dissection or aneurysm. Mild cardiomegaly. Aortic and coronary artery atherosclerosis. Mediastinum/Nodes: No masses or pathologically enlarged lymph nodes identified. Lungs/Pleura: Small left pleural effusion. Bibasilar atelectasis, left side greater than right. No evidence of pulmonary consolidation or mass. Central tracheobronchial airways are patent. Upper abdomen: No acute findings. Musculoskeletal: No suspicious bone lesions identified. Review of the MIP images confirms the above findings. IMPRESSION: No evidence of pulmonary embolism. Small left pleural effusion, and bibasilar atelectasis, left side greater than right. Aortic Atherosclerosis (ICD10-I70.0). Coronary artery calcification. Electronically Signed   By: Earle Gell M.D.   On: 10/09/2017 12:08   Ct Cervical Spine Wo Contrast  Result Date: 10/06/2017 CLINICAL DATA:  83 year old female status post fall 2 days ago, struck back of head on furniture. Fall again at 0430 hours today. Dizziness, confusion, posterior headache and neck pain. EXAM: CT HEAD WITHOUT CONTRAST CT CERVICAL SPINE WITHOUT CONTRAST TECHNIQUE: Multidetector CT imaging of the head and cervical spine was performed  following the standard protocol without intravenous contrast. Multiplanar CT image reconstructions of the cervical spine were also generated. COMPARISON:  Chest and rib radiographs 0758 hours today. FINDINGS: CT HEAD FINDINGS Brain: No midline shift, mass effect, or evidence of intracranial mass lesion. No ventriculomegaly. No acute intracranial hemorrhage identified. Patchy and confluent bilateral cerebral white matter hypodensity. Deep white matter capsule involvement. Age indeterminate heterogeneity in the left thalamus. No cerebral cortical encephalomalacia but probable chronic lacunar infarct in the right cerebellum on series 3, image 7. No cortically based acute infarct identified. Vascular: Calcified atherosclerosis at the skull base. No suspicious intracranial vascular hyperdensity. Skull: Osteopenia with prominent skull vascular channels (normal variant). No skull fracture identified. Sinuses/Orbits: Clear.  Tympanic cavities are clear. Other: No scalp hematoma identified. Normal for age orbits soft tissues. CT CERVICAL SPINE FINDINGS Alignment: Exaggerated upper cervical lordosis. Straightening of the lower cervical spine with 1-2 mm of anterolisthesis of C4 on C5 and C5 on C6. Bilateral posterior element alignment is within normal limits. Cervicothoracic junction alignment is within normal limits. Skull base and vertebrae: Osteopenia. Visualized skull base is intact. No atlanto-occipital dissociation. No cervical spine fracture identified. Soft tissues and spinal canal: No prevertebral fluid or swelling. No visible canal hematoma. Calcified carotid atherosclerosis. Otherwise negative noncontrast neck soft tissues. Disc levels: Widespread degenerative cervical facet hypertrophy, moderate to severe in the upper  cervical spine. Disc space loss with endplate spurring at P2-R5. Bulky and partially calcified degenerative ligamentous hypertrophy about the odontoid. Anterior C1-odontoid joint space loss with  subchondral cysts. No cervical spinal stenosis suspected. Upper chest: Mild T3 superior endplate compression with 20% loss of vertebral body height. No retropulsion. T3 posterior elements appear intact. Questionable mild T4 superior endplate compression. Other visible upper thoracic levels appear intact. Small calcified granuloma in the right lung apex. Negative noncontrast thoracic inlet. IMPRESSION: 1. No acute head trauma identified. 2. Age indeterminate small vessel disease in the cerebral white matter, left thalamus, and right cerebellum. 3. Age indeterminate mild T3 superior endplate compression fracture. Thoracic MRI or Nuclear Medicine Whole-body Bone Scan would best determine acuity. 4. No acute fracture or listhesis in the cervical spine. Multilevel degenerative appearing spondylolisthesis. Electronically Signed   By: Genevie Ann M.D.   On: 10/06/2017 08:57   Chest Portable 1 View  Result Date: 10/08/2017 CLINICAL DATA:  Preop for left hip fracture.  Fall today. EXAM: PORTABLE CHEST 1 VIEW COMPARISON:  Chest x-ray 10/06/2017. FINDINGS: Heart is enlarged. Changes of COPD are present with chronic interstitial coarsening and hyperinflation. Bibasilar atelectasis or scarring is noted. Atherosclerotic calcifications are present at the aortic arch. Granulomatous changes are noted. IMPRESSION: 1. Mild bibasilar atelectasis or scarring. 2. Chronic changes of COPD. 3. No acute cardiopulmonary disease PICC Electronically Signed   By: San Morelle M.D.   On: 10/08/2017 17:15   Dg C-arm 1-60 Min-no Report  Result Date: 10/08/2017 Fluoroscopy was utilized by the requesting physician.  No radiographic interpretation.   Dg Hip Operative Unilat W Or W/o Pelvis Left  Result Date: 10/08/2017 CLINICAL DATA:  Hip fixation EXAM: OPERATIVE LEFT HIP (WITH PELVIS IF PERFORMED) 2 VIEWS TECHNIQUE: Fluoroscopic spot image(s) were submitted for interpretation post-operatively. COMPARISON:  Radiography from  yesterday FINDINGS: Three cannulated screws transfix the basicervical femoral neck fracture on the left. Anatomic alignment has been restored. Left femoral nail that is partially visualized. IMPRESSION: Fluoroscopy for left femoral neck fracture ORIF. No unexpected finding. Electronically Signed   By: Monte Fantasia M.D.   On: 10/08/2017 20:23   Dg Hip Unilat With Pelvis 2-3 Views Left  Result Date: 10/08/2017 CLINICAL DATA:  Left hip pain after fall. EXAM: DG HIP (WITH OR WITHOUT PELVIS) 2-3V LEFT; LEFT FEMUR 1 VIEW COMPARISON:  None. FINDINGS: New transcervical left femoral neck fracture. Prior left femoral intramedullary nail placement. Partially visualized left total knee arthroplasty. Prior cephalomedullary rod fixation of the right proximal femur with healing versus healed lesser trochanteric fracture, possibly stress related. Diffuse osteopenia. The sacroiliac joints and pubic symphysis are intact. IMPRESSION: 1. New transcervical left femoral neck fracture. Electronically Signed   By: Titus Dubin M.D.   On: 10/08/2017 12:38   Dg Femur 1v Left  Result Date: 10/08/2017 CLINICAL DATA:  Left hip pain after fall. EXAM: DG HIP (WITH OR WITHOUT PELVIS) 2-3V LEFT; LEFT FEMUR 1 VIEW COMPARISON:  None. FINDINGS: New transcervical left femoral neck fracture. Prior left femoral intramedullary nail placement. Partially visualized left total knee arthroplasty. Prior cephalomedullary rod fixation of the right proximal femur with healing versus healed lesser trochanteric fracture, possibly stress related. Diffuse osteopenia. The sacroiliac joints and pubic symphysis are intact. IMPRESSION: 1. New transcervical left femoral neck fracture. Electronically Signed   By: Titus Dubin M.D.   On: 10/08/2017 12:38     Subjective: No complains Discharge Exam: Vitals:   10/12/17 0232 10/12/17 0602  BP: (!) 158/90 133/76  Pulse: Marland Kitchen)  103 (!) 105  Resp:  18  Temp:  98.6 F (37 C)  SpO2:  93%   Vitals:    10/11/17 1805 10/11/17 2111 10/12/17 0232 10/12/17 0602  BP:  (!) 151/86 (!) 158/90 133/76  Pulse: (!) 101 (!) 105 (!) 103 (!) 105  Resp:  18  18  Temp:  97.8 F (36.6 C)  98.6 F (37 C)  TempSrc:  Axillary  Oral  SpO2: 94% 95%  93%  Weight:      Height:        General: Pt is alert, awake, not in acute distress Cardiovascular: RRR, S1/S2 +, no rubs, no gallops Respiratory: CTA bilaterally, no wheezing, no rhonchi Abdominal: Soft, NT, ND, bowel sounds + Extremities: no edema, no cyanosis    The results of significant diagnostics from this hospitalization (including imaging, microbiology, ancillary and laboratory) are listed below for reference.     Microbiology: Recent Results (from the past 240 hour(s))  Urine culture     Status: Abnormal   Collection Time: 10/06/17  9:11 AM  Result Value Ref Range Status   Specimen Description URINE, CLEAN CATCH  Final   Special Requests NONE  Final   Culture >=100,000 COLONIES/mL ESCHERICHIA COLI (A)  Final   Report Status 10/08/2017 FINAL  Final   Organism ID, Bacteria ESCHERICHIA COLI (A)  Final      Susceptibility   Escherichia coli - MIC*    AMPICILLIN >=32 RESISTANT Resistant     CEFAZOLIN >=64 RESISTANT Resistant     CEFTRIAXONE <=1 SENSITIVE Sensitive     CIPROFLOXACIN >=4 RESISTANT Resistant     GENTAMICIN <=1 SENSITIVE Sensitive     IMIPENEM <=0.25 SENSITIVE Sensitive     NITROFURANTOIN <=16 SENSITIVE Sensitive     TRIMETH/SULFA <=20 SENSITIVE Sensitive     AMPICILLIN/SULBACTAM >=32 RESISTANT Resistant     PIP/TAZO 32 INTERMEDIATE Intermediate     Extended ESBL NEGATIVE Sensitive     * >=100,000 COLONIES/mL ESCHERICHIA COLI     Labs: BNP (last 3 results) Recent Labs    10/09/17 0624  BNP 086.7*   Basic Metabolic Panel: Recent Labs  Lab 10/08/17 1230 10/09/17 0624 10/10/17 0410  NA 134* 135 132*  K 4.4 3.7 3.7  CL 97* 99* 101  CO2 25 25 23   GLUCOSE 121* 141* 116*  BUN 8 10 11   CREATININE 0.71 0.49 0.52   CALCIUM 10.0 9.0 8.5*   Liver Function Tests: No results for input(s): AST, ALT, ALKPHOS, BILITOT, PROT, ALBUMIN in the last 168 hours. No results for input(s): LIPASE, AMYLASE in the last 168 hours. No results for input(s): AMMONIA in the last 168 hours. CBC: Recent Labs  Lab 10/08/17 1230 10/09/17 0624 10/10/17 0410 10/11/17 0536  WBC 7.5 7.2 7.4 6.7  NEUTROABS 5.4  --   --   --   HGB 15.5* 13.3 11.8* 11.5*  HCT 44.6 39.9 36.6 34.4*  MCV 93.9 95.7 96.8 94.5  PLT 187 170 148* 168   Cardiac Enzymes: No results for input(s): CKTOTAL, CKMB, CKMBINDEX, TROPONINI in the last 168 hours. BNP: Invalid input(s): POCBNP CBG: No results for input(s): GLUCAP in the last 168 hours. D-Dimer No results for input(s): DDIMER in the last 72 hours. Hgb A1c No results for input(s): HGBA1C in the last 72 hours. Lipid Profile No results for input(s): CHOL, HDL, LDLCALC, TRIG, CHOLHDL, LDLDIRECT in the last 72 hours. Thyroid function studies No results for input(s): TSH, T4TOTAL, T3FREE, THYROIDAB in the last 72 hours.  Invalid input(s): FREET3 Anemia work up No results for input(s): VITAMINB12, FOLATE, FERRITIN, TIBC, IRON, RETICCTPCT in the last 72 hours. Urinalysis    Component Value Date/Time   COLORURINE AMBER (A) 10/06/2017 0911   APPEARANCEUR CLEAR 10/06/2017 0911   LABSPEC 1.008 10/06/2017 0911   PHURINE 7.0 10/06/2017 0911   GLUCOSEU NEGATIVE 10/06/2017 0911   HGBUR MODERATE (A) 10/06/2017 0911   BILIRUBINUR NEGATIVE 10/06/2017 0911   KETONESUR NEGATIVE 10/06/2017 0911   PROTEINUR NEGATIVE 10/06/2017 0911   NITRITE POSITIVE (A) 10/06/2017 0911   LEUKOCYTESUR LARGE (A) 10/06/2017 0911   Sepsis Labs Invalid input(s): PROCALCITONIN,  WBC,  LACTICIDVEN Microbiology Recent Results (from the past 240 hour(s))  Urine culture     Status: Abnormal   Collection Time: 10/06/17  9:11 AM  Result Value Ref Range Status   Specimen Description URINE, CLEAN CATCH  Final   Special  Requests NONE  Final   Culture >=100,000 COLONIES/mL ESCHERICHIA COLI (A)  Final   Report Status 10/08/2017 FINAL  Final   Organism ID, Bacteria ESCHERICHIA COLI (A)  Final      Susceptibility   Escherichia coli - MIC*    AMPICILLIN >=32 RESISTANT Resistant     CEFAZOLIN >=64 RESISTANT Resistant     CEFTRIAXONE <=1 SENSITIVE Sensitive     CIPROFLOXACIN >=4 RESISTANT Resistant     GENTAMICIN <=1 SENSITIVE Sensitive     IMIPENEM <=0.25 SENSITIVE Sensitive     NITROFURANTOIN <=16 SENSITIVE Sensitive     TRIMETH/SULFA <=20 SENSITIVE Sensitive     AMPICILLIN/SULBACTAM >=32 RESISTANT Resistant     PIP/TAZO 32 INTERMEDIATE Intermediate     Extended ESBL NEGATIVE Sensitive     * >=100,000 COLONIES/mL ESCHERICHIA COLI     Time coordinating discharge: Over 30 minutes  SIGNED:   Charlynne Cousins, MD  [facility] 10/12/2017, 9:05 AM Pager   If 7PM-7AM, please contact night-coverage [email or web address] Password [email or web address]

## 2017-10-12 NOTE — Care Management Important Message (Signed)
Important Message  Patient Details  Name: Raven Snyder MRN: [id number] Date of Birth: 04-Nov-1929   Medicare Important Message Given:  Yes    Erenest Rasher, RN 10/12/2017, 10:23 AM

## 2017-10-12 NOTE — Care Management Note (Signed)
Case Management Note  Patient Details  Name: Preslyn Warr MRN: [id number] Date of Birth: 1928-12-07  Subjective/Objective:    Left displaced femoral neck fracture, acute UTI                Action/Plan: Discharge Planning: Chart reviewed. NCM spoke to pt and dtr assist with her care. Explained IM to pt. Pt scheduled for dc to SNF. CSW following for SNF placement.    Expected Discharge Date:  10/12/17               Expected Discharge Plan:  [place]  In-House Referral:  Clinical Social Work  Discharge planning Services  CM Consult  Post Acute Care Choice:  NA Choice offered to:  NA  DME Arranged:  N/A DME Agency:  NA  HH Arranged:  NA HH Agency:  NA  Status of Service:  Completed, signed off  If discussed at [employer] of Stay Meetings, dates discussed:    Additional Comments:  Erenest Rasher, RN 10/12/2017, 10:24 AM

## 2017-10-13 ENCOUNTER — Non-Acute Institutional Stay (SKILLED_NURSING_FACILITY): Payer: Medicare Other | Admitting: Internal Medicine

## 2017-10-13 ENCOUNTER — Encounter: Payer: Self-pay | Admitting: Internal Medicine

## 2017-10-13 DIAGNOSIS — R000 Tachycardia, unspecified: Secondary | ICD-10-CM | POA: Diagnosis not present

## 2017-10-13 DIAGNOSIS — E034 Atrophy of thyroid (acquired): Secondary | ICD-10-CM

## 2017-10-13 DIAGNOSIS — S72002A Fracture of unspecified part of neck of left femur, initial encounter for closed fracture: Secondary | ICD-10-CM

## 2017-10-13 DIAGNOSIS — B9620 Unspecified Escherichia coli [E. coli] as the cause of diseases classified elsewhere: Secondary | ICD-10-CM

## 2017-10-13 DIAGNOSIS — I5032 Chronic diastolic (congestive) heart failure: Secondary | ICD-10-CM

## 2017-10-13 DIAGNOSIS — D62 Acute posthemorrhagic anemia: Secondary | ICD-10-CM

## 2017-10-13 DIAGNOSIS — J9621 Acute and chronic respiratory failure with hypoxia: Secondary | ICD-10-CM

## 2017-10-13 DIAGNOSIS — F419 Anxiety disorder, unspecified: Secondary | ICD-10-CM

## 2017-10-13 DIAGNOSIS — K219 Gastro-esophageal reflux disease without esophagitis: Secondary | ICD-10-CM

## 2017-10-13 DIAGNOSIS — N390 Urinary tract infection, site not specified: Secondary | ICD-10-CM

## 2017-10-13 NOTE — Progress Notes (Signed)
: Provider:  Noah Delaine. [name], MD Location:  [place] Room Number: [phone] Place of Service:  SNF (31)  PCP: System, Pcp Not In Patient Care Team: System, Pcp Not In as PCP - General  Extended Emergency Contact Information Primary Emergency Contact: [place] Mobile Phone: [phone] Relation: Daughter     Allergies: Accupril [quinapril hcl]; Bactrim [sulfamethoxazole-trimethoprim]; Clindamycin/lincomycin; Mold extract [trichophyton]; Morphine and related; Nsaids; Penicillins; Shellfish allergy; Tolmetin; Tramadol; Yeast-related products; Zantac [ranitidine hcl]; and Doxycycline  Chief Complaint  Patient presents with  . New Admit To SNF    following hospitalization 10/08/17 to 10/12/17 left displaced femoral neck fracture from a fall. Surgery 10/08/17 Dr. Maureen Ralphs    HPI: Patient is 83 y.o. female with SVT hypothyroidism was seen in the ED several days ago for UTI. At the time she began having dizziness for several days. She was prescribed Cipro and discharged home. They've admission she felt lightheaded and got tangled up trying to turn using her walker and fell and landed on her left side. Patient was admitted to the [facility] from 11/21-25 where she was treated for femoral neck fracture and atrial fibrillation, rate controlled but a new finding. Hospital course was also complicated by hypoxemia with chest x-ray showing chronic COPD but no acute findings. Cultures grew out Escherichia coli resistant to Cipro and patient was treated with Rocephin and Macrobid. Patient was admitted to skilled nursing facility for OT/PT. While at skilled nursing facility patient will be followed for hypothyroidism treated with Synthroid, anxiety treated with Xanax And GERD treated with Prilosec.  Past Medical History:  Diagnosis Date  . Anxiety   . colon ca dx'd 1999  . Hypothyroidism   . SVT (supraventricular tachycardia) (HCC)     Past Surgical History:    Procedure Laterality Date  . FRACTURE SURGERY    . HIP PINNING,CANNULATED Left 10/08/2017   Procedure: CANNULATED HIP PINNING;  Surgeon: Gaynelle Arabian, MD;  Location: [facility];  Service: Orthopedics;  Laterality: Left;  . THYROIDECTOMY, PARTIAL      Allergies as of 10/13/2017      Reactions   Accupril [quinapril Hcl]    Per Assisted Living Documents--reaction unknown   Bactrim [sulfamethoxazole-trimethoprim]    Per Assisted Living Documents--reaction unknown   Clindamycin/lincomycin    Per Assisted Living Documents--reaction unknown   Mold Extract [trichophyton]    Per Assisted Living Documents--reaction unknown   Morphine And Related    Per Assisted Living Documents--reaction unknown   Nsaids Other (See Comments)   Makes interstitial cystis symptoms worse.    Penicillins    Per Assisted Living Documents--reaction unknown   Shellfish Allergy    Per Assisted Living Documents--reaction unknown   Tolmetin    Per Assisted Living Documents--reaction unknown   Tramadol Nausea Only   Makes dizzy    Yeast-related Products    Per Assisted Living Documents--reaction unknown   Zantac [ranitidine Hcl]    Per Assisted Living Documents--reaction unknown   Doxycycline Rash      Medication List        Accurate as of 10/13/17  3:09 PM. Always use your most recent med list.          acetaminophen 500 MG tablet Commonly known as:  TYLENOL Take 1,000 mg 2 (two) times daily by mouth.   ALPRAZolam 0.5 MG tablet Commonly known as:  XANAX Take 1 tablet (0.5 mg total) by mouth 4 (four) times daily.   amiodarone 100 MG tablet Commonly known as:  PACERONE Take 100 mg daily by mouth.   AZO-STANDARD 95 MG tablet Generic drug:  phenazopyridine Take 95 mg 2 (two) times daily as needed by mouth for pain.   docusate sodium 100 MG capsule Commonly known as:  COLACE Take 1 capsule (100 mg total) by mouth 2 (two) times daily.   furosemide 20 MG tablet Commonly known as:  LASIX Take 1  tablet (20 mg total) by mouth daily.   HYDROcodone-acetaminophen 5-325 MG tablet Commonly known as:  NORCO/VICODIN Take 1-2 tablets by mouth every 6 (six) hours as needed for moderate pain.   levothyroxine 100 MCG tablet Commonly known as:  SYNTHROID, LEVOTHROID Take 100 mcg daily before breakfast by mouth.   loratadine 10 MG tablet Commonly known as:  CLARITIN Take 10 mg daily as needed by mouth for allergies.   metoprolol tartrate 25 MG tablet Commonly known as:  LOPRESSOR Take 1 tablet (25 mg total) by mouth 2 (two) times daily.   nitrofurantoin (macrocrystal-monohydrate) 100 MG capsule Commonly known as:  MACROBID Take 1 capsule (100 mg total) by mouth every 12 (twelve) hours.   omeprazole 20 MG capsule Commonly known as:  PRILOSEC Take 20 mg daily by mouth.   polyethylene glycol packet Commonly known as:  MIRALAX / GLYCOLAX Take 17 g by mouth 2 (two) times daily.   SENOKOT 8.6 MG tablet Generic drug:  senna Take 1 tablet daily as needed by mouth for constipation.   sodium chloride 1 g tablet Take 1 g 2 (two) times daily by mouth.       No orders of the defined types were placed in this encounter.    There is no immunization history on file for this patient.  Social History   Tobacco Use  . Smoking status: Never Smoker  . Smokeless tobacco: Never Used  Substance Use Topics  . Alcohol use: No    Frequency: Never    Family history is unable to obtain secondary to patient's poor memory.  History reviewed. No pertinent family history.    Review of Systems  DATA OBTAINED: from patient-limited; nurse-no concerns GENERAL:  no fevers, fatigue, appetite changes SKIN: No itching, or rash EYES: No eye pain, redness, discharge EARS: No earache, tinnitus, change in hearing NOSE: No congestion, drainage or bleeding  MOUTH/THROAT: No mouth or tooth pain, No sore throat RESPIRATORY: No cough, wheezing, SOB CARDIAC: No chest pain, palpitations, lower  extremity edema  GI: No abdominal pain, No N/V/D or constipation, No heartburn or reflux  GU: No dysuria, frequency or urgency, or incontinence  MUSCULOSKELETAL: No unrelieved bone/joint pain NEUROLOGIC: No headache, dizziness or focal weakness PSYCHIATRIC: No c/o anxiety or sadness   Vitals:   10/13/17 1458  BP: (!) 148/86  Pulse: 99  Resp: 18  Temp: 98.3 F (36.8 C)  SpO2: 93%    SpO2 Readings from Last 1 Encounters:  10/13/17 93%   Body mass index is 36.78 kg/m.     Physical Exam  GENERAL APPEARANCE: Alert,   No acute distress.  SKIN: Incision site without redness excessive heat or swelling HEAD: Normocephalic, atraumatic  EYES: Conjunctiva/lids clear. Pupils round, reactive. EOMs intact.  EARS: External exam WNL, canals clear. Hearing grossly normal.  NOSE: No deformity or discharge.  MOUTH/THROAT: Lips w/o lesions  RESPIRATORY: Breathing is even, unlabored. Lung sounds are clear   CARDIOVASCULAR: Heart RRR no murmurs, rubs or gallops. No peripheral edema.   GASTROINTESTINAL: Abdomen is soft, non-tender, not distended w/ normal bowel sounds. GENITOURINARY: Bladder non tender,  not distended  MUSCULOSKELETAL: No abnormal joints or musculature NEUROLOGIC:  Cranial nerves 2-12 grossly intact. Moves all extremities  PSYCHIATRIC: Mood and affect appropriate to situation, no behavioral issues  Patient Active Problem List   Diagnosis Date Noted  . Constipation 10/12/2017  . Left displaced femoral neck fracture ([place]) 10/08/2017  . Hypothyroidism 10/08/2017  . Acute lower UTI 10/08/2017  . Anxiety 10/08/2017  . AF (paroxysmal atrial fibrillation) ([place]) 10/08/2017  . Acute on chronic respiratory failure with hypoxia ([place]) 10/08/2017  . SVT (supraventricular tachycardia) ([place]) 10/08/2017      Labs reviewed: Basic Metabolic Panel:    Component Value Date/Time   NA 132 (L) 10/10/2017 0410   K 3.7 10/10/2017 0410   CL 101 10/10/2017 0410   CO2 23 10/10/2017 0410    GLUCOSE 116 (H) 10/10/2017 0410   BUN 11 10/10/2017 0410   CREATININE 0.52 10/10/2017 0410   CALCIUM 8.5 (L) 10/10/2017 0410   GFRNONAA >60 10/10/2017 0410   GFRAA >60 10/10/2017 0410    Recent Labs    10/08/17 1230 10/09/17 0624 10/10/17 0410  NA 134* 135 132*  K 4.4 3.7 3.7  CL 97* 99* 101  CO2 25 25 23   GLUCOSE 121* 141* 116*  BUN 8 10 11   CREATININE 0.71 0.49 0.52  CALCIUM 10.0 9.0 8.5*   Liver Function Tests: No results for input(s): AST, ALT, ALKPHOS, BILITOT, PROT, ALBUMIN in the last 8760 hours. No results for input(s): LIPASE, AMYLASE in the last 8760 hours. No results for input(s): AMMONIA in the last 8760 hours. CBC: Recent Labs    10/08/17 1230 10/09/17 0624 10/10/17 0410 10/11/17 0536  WBC 7.5 7.2 7.4 6.7  NEUTROABS 5.4  --   --   --   HGB 15.5* 13.3 11.8* 11.5*  HCT 44.6 39.9 36.6 34.4*  MCV 93.9 95.7 96.8 94.5  PLT 187 170 148* 168   Lipid No results for input(s): CHOL, HDL, LDLCALC, TRIG in the last 8760 hours.  Cardiac Enzymes: No results for input(s): CKTOTAL, CKMB, CKMBINDEX, TROPONINI in the last 8760 hours. BNP: Recent Labs    10/09/17 0624  BNP 230.6*   No results found for: MICROALBUR No results found for: HGBA1C No results found for: TSH No results found for: VITAMINB12 No results found for: FOLATE No results found for: IRON, TIBC, FERRITIN  Imaging and Procedures obtained prior to SNF admission: Dg Lumbar Spine Complete  Result Date: 10/08/2017 CLINICAL DATA:  Left hip pain secondary to a fall from a wheelchair today. EXAM: LUMBAR SPINE - COMPLETE 4+ VIEW COMPARISON:  Radiographs of the chest and left ribs dated 10/06/2017 FINDINGS: There are compression fractures of the superior and inferior endplates of L2. Slight retrolisthesis of L2 on L3. There is a prominent Schmorl's node in the superior endplate of L1, probably not acute. There is marked osteopenia. Grade 1 spondylolisthesis at L4-5. No significant disc space narrowing.  Aortic atherosclerosis. IMPRESSION: Compression fractures of L2 which could be acute. Marked osteopenia. Aortic Atherosclerosis (ICD10-I70.0). Electronically Signed   By: Lorriane Shire M.D.   On: 10/08/2017 12:43   Ct Angio Chest Pe W Or Wo Contrast  Result Date: 10/09/2017 CLINICAL DATA:  Worsening hypoxemia. One day postop from internal fixation of left hip fracture. EXAM: CT ANGIOGRAPHY CHEST WITH CONTRAST TECHNIQUE: Multidetector CT imaging of the chest was performed using the standard protocol during bolus administration of intravenous contrast. Multiplanar CT image reconstructions and MIPs were obtained to evaluate the vascular anatomy. CONTRAST:  17mL ISOVUE-370  IOPAMIDOL (ISOVUE-370) INJECTION 76% COMPARISON:  Chest radiograph on 10/08/2017 FINDINGS: Cardiovascular: Satisfactory opacification of pulmonary arteries noted, and no pulmonary emboli identified. No evidence of thoracic aortic dissection or aneurysm. Mild cardiomegaly. Aortic and coronary artery atherosclerosis. Mediastinum/Nodes: No masses or pathologically enlarged lymph nodes identified. Lungs/Pleura: Small left pleural effusion. Bibasilar atelectasis, left side greater than right. No evidence of pulmonary consolidation or mass. Central tracheobronchial airways are patent. Upper abdomen: No acute findings. Musculoskeletal: No suspicious bone lesions identified. Review of the MIP images confirms the above findings. IMPRESSION: No evidence of pulmonary embolism. Small left pleural effusion, and bibasilar atelectasis, left side greater than right. Aortic Atherosclerosis (ICD10-I70.0). Coronary artery calcification. Electronically Signed   By: Earle Gell M.D.   On: 10/09/2017 12:08   Chest Portable 1 View  Result Date: 10/08/2017 CLINICAL DATA:  Preop for left hip fracture.  Fall today. EXAM: PORTABLE CHEST 1 VIEW COMPARISON:  Chest x-ray 10/06/2017. FINDINGS: Heart is enlarged. Changes of COPD are present with chronic interstitial  coarsening and hyperinflation. Bibasilar atelectasis or scarring is noted. Atherosclerotic calcifications are present at the aortic arch. Granulomatous changes are noted. IMPRESSION: 1. Mild bibasilar atelectasis or scarring. 2. Chronic changes of COPD. 3. No acute cardiopulmonary disease PICC Electronically Signed   By: San Morelle M.D.   On: 10/08/2017 17:15   Dg C-arm 1-60 Min-no Report  Result Date: 10/08/2017 Fluoroscopy was utilized by the requesting physician.  No radiographic interpretation.   Dg Hip Operative Unilat W Or W/o Pelvis Left  Result Date: 10/08/2017 CLINICAL DATA:  Hip fixation EXAM: OPERATIVE LEFT HIP (WITH PELVIS IF PERFORMED) 2 VIEWS TECHNIQUE: Fluoroscopic spot image(s) were submitted for interpretation post-operatively. COMPARISON:  Radiography from yesterday FINDINGS: Three cannulated screws transfix the basicervical femoral neck fracture on the left. Anatomic alignment has been restored. Left femoral nail that is partially visualized. IMPRESSION: Fluoroscopy for left femoral neck fracture ORIF. No unexpected finding. Electronically Signed   By: Monte Fantasia M.D.   On: 10/08/2017 20:23   Dg Hip Unilat With Pelvis 2-3 Views Left  Result Date: 10/08/2017 CLINICAL DATA:  Left hip pain after fall. EXAM: DG HIP (WITH OR WITHOUT PELVIS) 2-3V LEFT; LEFT FEMUR 1 VIEW COMPARISON:  None. FINDINGS: New transcervical left femoral neck fracture. Prior left femoral intramedullary nail placement. Partially visualized left total knee arthroplasty. Prior cephalomedullary rod fixation of the right proximal femur with healing versus healed lesser trochanteric fracture, possibly stress related. Diffuse osteopenia. The sacroiliac joints and pubic symphysis are intact. IMPRESSION: 1. New transcervical left femoral neck fracture. Electronically Signed   By: Titus Dubin M.D.   On: 10/08/2017 12:38   Dg Femur 1v Left  Result Date: 10/08/2017 CLINICAL DATA:  Left hip pain after  fall. EXAM: DG HIP (WITH OR WITHOUT PELVIS) 2-3V LEFT; LEFT FEMUR 1 VIEW COMPARISON:  None. FINDINGS: New transcervical left femoral neck fracture. Prior left femoral intramedullary nail placement. Partially visualized left total knee arthroplasty. Prior cephalomedullary rod fixation of the right proximal femur with healing versus healed lesser trochanteric fracture, possibly stress related. Diffuse osteopenia. The sacroiliac joints and pubic symphysis are intact. IMPRESSION: 1. New transcervical left femoral neck fracture. Electronically Signed   By: Titus Dubin M.D.   On: 10/08/2017 12:38     Not all labs, radiology exams or other studies done during hospitalization come through on my EPIC note; however they are reviewed by me.    Assessment and Plan  LEFT DISPLACED FEMORAL FRACTURE-surgery performed 10/08/2017 without problems SNF - patient  admitted for OT/PT  ACUTE BLOOD LOSS ANEMIA postop-preop hemoglobin 15.5, postop hemoglobin 11.5 SNF - will follow-up CBC  E COLI UTI-resistant to Cipro, sensitive to Macrobid; was treated with Rocephin until the MIC came out SNF - continue Macrobid 100 mg every 12 hours for 7 days  ACUTE CHRONIC RESPIRATORY FAILURE WITH HYPOXIA-PATIENT WAS NOT ON OXYGEN PRIOR TO ADMISSION BUT REQUIRED O2 2 L NASAL CANNULA TO KEEP HER O2 SAT ABOVE 92%; CHEST X-RAY SHOWED NO FLUID, SOME COPD CHANGES, BNP WAS 2:30, 2-D ECHO SHOWED EJECTION FRACTION OF 45% WITH GRADE 1 DIASTOLIC HEART FAILURE PATIENT WAS STARTED ON LOW-DOSE BETA BLOCKER AND LASIX SNF - continue Lasix 20 min milligrams by mouth daily and Lopressor 25 mg by mouth twice a day  SINUS TACHYCARDIA-patient with history of SVT on amiodarone, was started on low-dose metoprolol; 2-D echo with EF of 45% with grade 1 diastolic heart failure SNF - continue amiodarone 100 mg by mouth daily and metoprolol 25 mg by mouth twice a day  ANXIETY  SNF - continue Xanax 0.5 mg 4 times daily  GERD SNF - continue Prilosec  20 mg by mouth daily  HYPOTHYROIDISM SNF - stable; continue Synthroid 100 g by mouth daily   Time spent greater than 45 minutes;> 50% of time with patient was spent reviewing records, labs, tests and studies, counseling and developing plan of care  Webb Silversmith D. [name], MD

## 2017-10-14 ENCOUNTER — Encounter (HOSPITAL_COMMUNITY): Payer: Self-pay | Admitting: Orthopedic Surgery

## 2017-10-18 ENCOUNTER — Encounter (HOSPITAL_COMMUNITY): Payer: Self-pay | Admitting: Orthopedic Surgery

## 2017-10-19 ENCOUNTER — Encounter: Payer: Self-pay | Admitting: Internal Medicine

## 2017-10-19 DIAGNOSIS — I5030 Unspecified diastolic (congestive) heart failure: Secondary | ICD-10-CM | POA: Insufficient documentation

## 2017-10-19 DIAGNOSIS — D62 Acute posthemorrhagic anemia: Secondary | ICD-10-CM | POA: Insufficient documentation

## 2017-10-19 DIAGNOSIS — K219 Gastro-esophageal reflux disease without esophagitis: Secondary | ICD-10-CM | POA: Insufficient documentation

## 2017-10-19 DIAGNOSIS — R000 Tachycardia, unspecified: Secondary | ICD-10-CM | POA: Insufficient documentation

## 2017-10-24 LAB — BASIC METABOLIC PANEL WITH GFR
BUN: 9 (ref 4–21)
Creatinine: 0.4 — AB (ref 0.5–1.1)
Glucose: 147
Potassium: 3.8 (ref 3.4–5.3)
Sodium: 130 — AB (ref 137–147)

## 2017-10-24 LAB — CBC AND DIFFERENTIAL
HCT: 39 (ref 36–46)
Hemoglobin: 12.8 (ref 12.0–16.0)
Platelets: 269 (ref 150–399)
WBC: 5.9

## 2017-10-30 ENCOUNTER — Other Ambulatory Visit: Payer: Self-pay

## 2017-11-05 ENCOUNTER — Non-Acute Institutional Stay (SKILLED_NURSING_FACILITY): Payer: Medicare Other | Admitting: Internal Medicine

## 2017-11-05 ENCOUNTER — Encounter: Payer: Self-pay | Admitting: Internal Medicine

## 2017-11-05 DIAGNOSIS — K219 Gastro-esophageal reflux disease without esophagitis: Secondary | ICD-10-CM

## 2017-11-05 DIAGNOSIS — I5032 Chronic diastolic (congestive) heart failure: Secondary | ICD-10-CM | POA: Diagnosis not present

## 2017-11-05 DIAGNOSIS — I480 Paroxysmal atrial fibrillation: Secondary | ICD-10-CM

## 2017-11-05 NOTE — Progress Notes (Signed)
Location:  [place] Room Number: [place]:  SNF [phone])  Provider: Noah Delaine. [name], MD  System, [place] Not In  Patient Care Team: System, Pcp Not In as PCP - General  Extended Emergency Contact Information Primary Emergency Contact: [place] Mobile Phone: [phone] Relation: Daughter    Allergies: Accupril [quinapril hcl]; Bactrim [sulfamethoxazole-trimethoprim]; Clindamycin/lincomycin; Mold extract [trichophyton]; Morphine and related; Nsaids; Penicillins; Shellfish allergy; Tolmetin; Tramadol; Yeast-related products; Zantac [ranitidine hcl]; and Doxycycline  Chief Complaint  Patient presents with  . Medical Management of Chronic Issues    routine visit    HPI: Patient is 83 y.o. female who is being seen for routine issues of atrial fibrillation, diastolic congestive heart failure and GERD.   Past Medical History:  Diagnosis Date  . Anxiety   . colon ca dx'd 1999  . Hypothyroidism   . SVT (supraventricular tachycardia) (HCC)     Past Surgical History:  Procedure Laterality Date  . FRACTURE SURGERY    . HIP PINNING,CANNULATED Left 10/08/2017   Procedure: CANNULATED HIP PINNING;  Surgeon: Gaynelle Arabian, MD;  Location: [facility];  Service: Orthopedics;  Laterality: Left;  . THYROIDECTOMY, PARTIAL      Allergies as of 11/05/2017      Reactions   Accupril [quinapril Hcl]    Per Assisted Living Documents--reaction unknown   Bactrim [sulfamethoxazole-trimethoprim]    Per Assisted Living Documents--reaction unknown   Clindamycin/lincomycin    Per Assisted Living Documents--reaction unknown   Mold Extract [trichophyton]    Per Assisted Living Documents--reaction unknown   Morphine And Related    Per Assisted Living Documents--reaction unknown   Nsaids Other (See Comments)   Makes interstitial cystis symptoms worse.    Penicillins    Per Assisted Living Documents--reaction unknown   Shellfish Allergy    Per Assisted Living  Documents--reaction unknown   Tolmetin    Per Assisted Living Documents--reaction unknown   Tramadol Nausea Only   Makes dizzy    Yeast-related Products    Per Assisted Living Documents--reaction unknown   Zantac [ranitidine Hcl]    Per Assisted Living Documents--reaction unknown   Doxycycline Rash      Medication List        Accurate as of 11/05/17 11:59 PM. Always use your most recent med list.          acetaminophen 500 MG tablet Commonly known as:  TYLENOL Take 1,000 mg 2 (two) times daily by mouth.   ALPRAZolam 0.5 MG tablet Commonly known as:  XANAX Take 1 tablet (0.5 mg total) by mouth 4 (four) times daily.   amiodarone 100 MG tablet Commonly known as:  PACERONE Take 100 mg daily by mouth.   AZO-STANDARD 95 MG tablet Generic drug:  phenazopyridine Take 95 mg 2 (two) times daily as needed by mouth for pain.   docusate sodium 100 MG capsule Commonly known as:  COLACE Take 1 capsule (100 mg total) by mouth 2 (two) times daily.   ENSURE Take 1 Can by mouth 2 (two) times daily. One twice daily to support weight status   furosemide 20 MG tablet Commonly known as:  LASIX Take 1 tablet (20 mg total) by mouth daily.   HYDROcodone-acetaminophen 5-325 MG tablet Commonly known as:  NORCO/VICODIN Take 1 tablet by mouth every 6 (six) hours as needed (for pain. Take for 14days). Take one tablet every 6 hours as needed for pain. Stop 11/19/17   levothyroxine 100 MCG tablet Commonly known as:  SYNTHROID, LEVOTHROID  Take 100 mcg daily before breakfast by mouth.   loratadine 10 MG tablet Commonly known as:  CLARITIN Take 10 mg daily as needed by mouth for allergies.   metoprolol tartrate 25 MG tablet Commonly known as:  LOPRESSOR Take 1 tablet (25 mg total) by mouth 2 (two) times daily.   omeprazole 20 MG capsule Commonly known as:  PRILOSEC Take 20 mg daily by mouth.   polyethylene glycol packet Commonly known as:  MIRALAX / GLYCOLAX Take 17 g by mouth 2 (two)  times daily.   SENOKOT 8.6 MG tablet Generic drug:  senna Take 1 tablet by mouth daily as needed for constipation.   sodium chloride 1 g tablet Take 1 g 2 (two) times daily by mouth.       No orders of the defined types were placed in this encounter.    There is no immunization history on file for this patient.  Social History   Tobacco Use  . Smoking status: Never Smoker  . Smokeless tobacco: Never Used  Substance Use Topics  . Alcohol use: No    Frequency: Never    Review of Systems  DATA OBTAINED: from patient-limited; nursing-no acute concerns GENERAL:  no fevers, fatigue, appetite changes SKIN: No itching, rash HEENT: No complaint RESPIRATORY: No cough, wheezing, SOB CARDIAC: No chest pain, palpitations, lower extremity edema  GI: No abdominal pain, No N/V/D or constipation, No heartburn or reflux  GU: No dysuria, frequency or urgency, or incontinence  MUSCULOSKELETAL: No unrelieved bone/joint pain NEUROLOGIC: No headache, dizziness  PSYCHIATRIC: No overt anxiety or sadness  Vitals:   11/05/17 1606  BP: 100/60  Pulse: 93  Resp: 18  Temp: 97.6 F (36.4 C)  SpO2: 98%   Body mass index is 26.54 kg/m. Physical Exam  GENERAL APPEARANCE: Alert, conversant, No acute distress  SKIN: No diaphoresis rash HEENT: Unremarkable RESPIRATORY: Breathing is even, unlabored. Lung sounds are clear   CARDIOVASCULAR: Heart RRR no murmurs, rubs or gallops. No peripheral edema  GASTROINTESTINAL: Abdomen is soft, non-tender, not distended w/ normal bowel sounds.  GENITOURINARY: Bladder non tender, not distended  MUSCULOSKELETAL: No abnormal joints or musculature NEUROLOGIC: Cranial nerves 2-12 grossly intact. Moves all extremities PSYCHIATRIC: Mood and affect appropriate to situation, no behavioral issues  Patient Active Problem List   Diagnosis Date Noted  . Acute blood loss as cause of postoperative anemia 10/19/2017  . Diastolic congestive heart failure ([place])  10/19/2017  . Sinus tachycardia 10/19/2017  . GERD (gastroesophageal reflux disease) 10/19/2017  . Constipation 10/12/2017  . Left displaced femoral neck fracture ([place]) 10/08/2017  . Hypothyroidism 10/08/2017  . Escherichia coli urinary tract infection 10/08/2017  . Anxiety 10/08/2017  . AF (paroxysmal atrial fibrillation) ([place]) 10/08/2017  . Acute on chronic respiratory failure with hypoxia ([place]) 10/08/2017  . SVT (supraventricular tachycardia) (HCC) 10/08/2017    CMP     Component Value Date/Time   NA 133 (L) 11/20/2017 1005   NA 130 (A) 10/24/2017   K 3.7 11/20/2017 1005   CL 101 11/20/2017 1005   CO2 23 11/20/2017 1005   GLUCOSE 118 (H) 11/20/2017 1005   BUN 7 11/20/2017 1005   BUN 9 10/24/2017   CREATININE 0.67 11/20/2017 1005   CALCIUM 9.1 11/20/2017 1005   PROT 6.9 11/20/2017 1005   ALBUMIN 3.5 11/20/2017 1005   AST 20 11/20/2017 1005   ALT 13 (L) 11/20/2017 1005   ALKPHOS 86 11/20/2017 1005   BILITOT 0.7 11/20/2017 1005   GFRNONAA >60 11/20/2017 1005  GFRAA >60 11/20/2017 1005   Recent Labs    10/09/17 0624 10/10/17 0410 10/24/17 11/20/17 1005  NA 135 132* 130* 133*  K 3.7 3.7 3.8 3.7  CL 99* 101  --  101  CO2 25 23  --  23  GLUCOSE 141* 116*  --  118*  BUN 10 11 9 7   CREATININE 0.49 0.52 0.4* 0.67  CALCIUM 9.0 8.5*  --  9.1   Recent Labs    11/20/17 1005  AST 20  ALT 13*  ALKPHOS 86  BILITOT 0.7  PROT 6.9  ALBUMIN 3.5   Recent Labs    10/08/17 1230  10/10/17 0410 10/11/17 0536 10/24/17 11/20/17 1005  WBC 7.5   < > 7.4 6.7 5.9 6.7  NEUTROABS 5.4  --   --   --   --  4.9  HGB 15.5*   < > 11.8* 11.5* 12.8 13.3  HCT 44.6   < > 36.6 34.4* 39 39.1  MCV 93.9   < > 96.8 94.5  --  94.7  PLT 187   < > 148* 168 269 224   < > = values in this interval not displayed.   No results for input(s): CHOL, LDLCALC, TRIG in the last 8760 hours.  Invalid input(s): HCL No results found for: MICROALBUR No results found for: TSH No results found for:  HGBA1C No results found for: CHOL, HDL, LDLCALC, LDLDIRECT, TRIG, CHOLHDL  Significant Diagnostic Results in last 30 days:  Dg Chest Port 1 View  Result Date: 11/20/2017 CLINICAL DATA:  Syncope.  No recent fall.  Prior history of falls. EXAM: PORTABLE CHEST 1 VIEW COMPARISON:  CT 10/09/2017.  Chest x-ray 10/08/2017 . FINDINGS: Left apex incompletely imaged. Mediastinum calcified right hilar lymph nodes again noted. Low lung volumes with mild bibasilar atelectasis and or scarring. COPD. Heart size normal. Degenerative changes thoracic spine with thoracic spine scoliosis. Multiple left-sided rib fractures, these may be old. IMPRESSION: 1. Low lung volumes with mild bibasilar subsegmental atelectasis and/or scarring . 2. Multiple left-sided rib fractures, these may be old. No pneumothorax. Electronically Signed   By: Marcello Moores  Register   On: 11/20/2017 10:37    Assessment and Plan  AF (paroxysmal atrial fibrillation) ([place]) Stable; continue amiodarone 100 mg by mouth daily and metoprolol 25 mg by mouth twice a day for rate; patient is not on any prophylaxis  Diastolic congestive heart failure ([place]) Has been stable; continue metoprolol 25 mg by mouth twice a day, and Lasix  GERD (gastroesophageal reflux disease) No complaints of reflux; continue Prilosec 20 mg by mouth daily    Raven Colla D. [name], MD

## 2017-11-06 ENCOUNTER — Encounter: Payer: Self-pay | Admitting: Internal Medicine

## 2017-11-20 ENCOUNTER — Encounter (HOSPITAL_COMMUNITY): Payer: Self-pay | Admitting: Emergency Medicine

## 2017-11-20 ENCOUNTER — Emergency Department (HOSPITAL_COMMUNITY): Payer: Medicare Other

## 2017-11-20 ENCOUNTER — Emergency Department (HOSPITAL_COMMUNITY)
Admission: EM | Admit: 2017-11-20 | Discharge: 2017-11-20 | Disposition: A | Discharge status: Home / Self Care | Payer: Medicare Other | Attending: Emergency Medicine | Admitting: Emergency Medicine

## 2017-11-20 DIAGNOSIS — N390 Urinary tract infection, site not specified: Secondary | ICD-10-CM | POA: Insufficient documentation

## 2017-11-20 DIAGNOSIS — Z79899 Other long term (current) drug therapy: Secondary | ICD-10-CM | POA: Diagnosis not present

## 2017-11-20 DIAGNOSIS — R55 Syncope and collapse: Secondary | ICD-10-CM | POA: Diagnosis present

## 2017-11-20 DIAGNOSIS — E039 Hypothyroidism, unspecified: Secondary | ICD-10-CM | POA: Insufficient documentation

## 2017-11-20 LAB — URINALYSIS, ROUTINE W REFLEX MICROSCOPIC
Bilirubin Urine: NEGATIVE
Glucose, UA: NEGATIVE mg/dL
Ketones, ur: NEGATIVE mg/dL
NITRITE: POSITIVE — AB
PH: 6 (ref 5.0–8.0)
Protein, ur: NEGATIVE mg/dL
SPECIFIC GRAVITY, URINE: 1.006 (ref 1.005–1.030)

## 2017-11-20 LAB — CBC WITH DIFFERENTIAL/PLATELET
BASOS PCT: 0 %
Basophils Absolute: 0 10*3/uL (ref 0.0–0.1)
Eosinophils Absolute: 0.1 10*3/uL (ref 0.0–0.7)
Eosinophils Relative: 1 %
HEMATOCRIT: 39.1 % (ref 36.0–46.0)
Hemoglobin: 13.3 g/dL (ref 12.0–15.0)
LYMPHS PCT: 16 %
Lymphs Abs: 1.1 10*3/uL (ref 0.7–4.0)
MCH: 32.2 pg (ref 26.0–34.0)
MCHC: 34 g/dL (ref 30.0–36.0)
MCV: 94.7 fL (ref 78.0–100.0)
MONO ABS: 0.6 10*3/uL (ref 0.1–1.0)
MONOS PCT: 10 %
NEUTROS ABS: 4.9 10*3/uL (ref 1.7–7.7)
Neutrophils Relative %: 73 %
Platelets: 224 10*3/uL (ref 150–400)
RBC: 4.13 MIL/uL (ref 3.87–5.11)
RDW: 14.5 % (ref 11.5–15.5)
WBC: 6.7 10*3/uL (ref 4.0–10.5)

## 2017-11-20 LAB — COMPREHENSIVE METABOLIC PANEL
ALT: 13 U/L — ABNORMAL LOW (ref 14–54)
ANION GAP: 9 (ref 5–15)
AST: 20 U/L (ref 15–41)
Albumin: 3.5 g/dL (ref 3.5–5.0)
Alkaline Phosphatase: 86 U/L (ref 38–126)
BILIRUBIN TOTAL: 0.7 mg/dL (ref 0.3–1.2)
BUN: 7 mg/dL (ref 6–20)
CO2: 23 mmol/L (ref 22–32)
Calcium: 9.1 mg/dL (ref 8.9–10.3)
Chloride: 101 mmol/L (ref 101–111)
Creatinine, Ser: 0.67 mg/dL (ref 0.44–1.00)
GFR calc non Af Amer: >60 mL/min (ref >60)
Glucose, Bld: 118 mg/dL — ABNORMAL HIGH (ref 65–99)
POTASSIUM: 3.7 mmol/L (ref 3.5–5.1)
Sodium: 133 mmol/L — ABNORMAL LOW (ref 135–145)
TOTAL PROTEIN: 6.9 g/dL (ref 6.5–8.1)

## 2017-11-20 LAB — TROPONIN I
TROPONIN I: 0.06 ng/mL — AB (ref <0.03)
TROPONIN I: 0.07 ng/mL — AB (ref <0.03)

## 2017-11-20 MED ORDER — DEXTROSE 5 % IV SOLN
1.0000 g | Freq: Once | INTRAVENOUS | Status: AC
  Administered 2017-11-20: 1 g via INTRAVENOUS
  Filled 2017-11-20: qty 10

## 2017-11-20 MED ORDER — SODIUM CHLORIDE 0.9 % IV BOLUS (SEPSIS)
500.0000 mL | Freq: Once | INTRAVENOUS | Status: AC
  Administered 2017-11-20: 500 mL via INTRAVENOUS

## 2017-11-20 MED ORDER — ALPRAZOLAM 0.5 MG PO TABS
0.5000 mg | ORAL_TABLET | Freq: Once | ORAL | Status: AC
  Administered 2017-11-20: 0.5 mg via ORAL
  Filled 2017-11-20: qty 1

## 2017-11-20 MED ORDER — CEPHALEXIN 500 MG PO CAPS: 500.0000 mg | ORAL_CAPSULE | Freq: Three times a day (TID) | ORAL | 0 refills | Status: DC

## 2017-11-20 MED ORDER — SODIUM CHLORIDE 0.9 % IV BOLUS (SEPSIS): 500.0000 mL | Freq: Once | INTRAVENOUS | Status: DC

## 2017-11-20 NOTE — ED Provider Notes (Signed)
[facility] EMERGENCY DEPARTMENT Provider Note   CSN: [id number] Arrival date & time: 11/20/17  7654     History   Chief Complaint Chief Complaint  Patient presents with  . Loss of Consciousness    HPI Raven Snyder is a 84 y.o. female.  Level 5 caveat for mild dementia.  Patient is presently in [facility] rehabilitation for a left hip fracture.  This morning she was sitting on the toilet when she became pale, diaphoretic, "passed out for 10 seconds".  She has not been eating or drinking well lately.  She has lost 5 pounds in the past several days.  Her daughter reports that she takes opiates and benzodiazepines which affect her appetite.  No fever, sweats, chills, dysuria, neuro deficits, chest pain, dyspnea.      Past Medical History:  Diagnosis Date  . Anxiety   . colon ca dx'd 1999  . Hypothyroidism   . SVT (supraventricular tachycardia) [facility])     Patient Active Problem List   Diagnosis Date Noted  . Acute blood loss as cause of postoperative anemia 10/19/2017  . Diastolic congestive heart failure ([place]) 10/19/2017  . Sinus tachycardia 10/19/2017  . GERD (gastroesophageal reflux disease) 10/19/2017  . Constipation 10/12/2017  . Left displaced femoral neck fracture ([place]) 10/08/2017  . Hypothyroidism 10/08/2017  . Escherichia coli urinary tract infection 10/08/2017  . Anxiety 10/08/2017  . AF (paroxysmal atrial fibrillation) ([place]) 10/08/2017  . Acute on chronic respiratory failure with hypoxia ([place]) 10/08/2017  . SVT (supraventricular tachycardia) ([place]) 10/08/2017    Past Surgical History:  Procedure Laterality Date  . FRACTURE SURGERY    . HIP PINNING,CANNULATED Left 10/08/2017   Procedure: CANNULATED HIP PINNING;  Surgeon: Gaynelle Arabian, MD;  Location: [facility];  Service: Orthopedics;  Laterality: Left;  . THYROIDECTOMY, PARTIAL      OB History    No data available       Home Medications    Prior to Admission medications     Medication Sig Start Date End Date Taking? Authorizing Provider  acetaminophen (TYLENOL) 500 MG tablet Take 1,000 mg 2 (two) times daily by mouth.   Yes [provider]  ALPRAZolam (XANAX) 0.5 MG tablet Take 1 tablet (0.5 mg total) by mouth 4 (four) times daily. 10/12/17  Yes Charlynne Cousins, MD  amiodarone (PACERONE) 100 MG tablet Take 100 mg daily by mouth.   Yes [provider]  docusate sodium (COLACE) 100 MG capsule Take 1 capsule (100 mg total) by mouth 2 (two) times daily. 10/12/17  Yes Charlynne Cousins, MD  ENSURE (ENSURE) Take 1 Can by mouth 2 (two) times daily. One twice daily to support weight status    Yes [provider]  furosemide (LASIX) 20 MG tablet Take 1 tablet (20 mg total) by mouth daily. 10/12/17  Yes Charlynne Cousins, MD  HYDROcodone-acetaminophen (NORCO/VICODIN) 5-325 MG tablet Take 1 tablet by mouth every 6 (six) hours as needed (for pain. Take for 14days). Take one tablet every 6 hours as needed for pain. Stop 11/19/17    Yes [provider]  levothyroxine (SYNTHROID, LEVOTHROID) 100 MCG tablet Take 100 mcg daily before breakfast by mouth.   Yes [provider]  metoprolol tartrate (LOPRESSOR) 25 MG tablet Take 1 tablet (25 mg total) by mouth 2 (two) times daily. 10/12/17  Yes Charlynne Cousins, MD  omeprazole (PRILOSEC) 20 MG capsule Take 20 mg daily by mouth.   Yes [provider]  sodium chloride  1 g tablet Take 1 g 2 (two) times daily by mouth.   Yes [provider]  cephALEXin (KEFLEX) 500 MG capsule Take 1 capsule (500 mg total) by mouth 3 (three) times daily. 11/20/17   Nat Christen, MD  loratadine (CLARITIN) 10 MG tablet Take 10 mg daily as needed by mouth for allergies.    [provider]  phenazopyridine (AZO-STANDARD) 95 MG tablet Take 95 mg 2 (two) times daily as needed by mouth for pain.    [provider]  polyethylene glycol (MIRALAX / GLYCOLAX) packet Take 17 g by  mouth 2 (two) times daily. Patient taking differently: Take 17 g by mouth 2 (two) times daily as needed for mild constipation.  10/12/17   Charlynne Cousins, MD  senna (SENOKOT) 8.6 MG tablet Take 1 tablet by mouth daily as needed for constipation.     [provider]    Family History History reviewed. No pertinent family history.  Social History Social History   Tobacco Use  . Smoking status: Never Smoker  . Smokeless tobacco: Never Used  Substance Use Topics  . Alcohol use: No    Frequency: Never  . Drug use: No     Allergies   Accupril [quinapril hcl]; Bactrim [sulfamethoxazole-trimethoprim]; Clindamycin/lincomycin; Mold extract [trichophyton]; Morphine and related; Nsaids; Penicillins; Shellfish allergy; Tolmetin; Tramadol; Yeast-related products; Zantac [ranitidine hcl]; and Doxycycline   Review of Systems Review of Systems  Unable to perform ROS: Acuity of condition     Physical Exam Updated Vital Signs BP 119/72   Pulse 94   Temp 97.8 F (36.6 C) (Oral)   Resp 16   Ht 4\' 11"  (1.499 m)   Wt 56.2 kg (124 lb)   SpO2 93%   BMI 25.04 kg/m   Physical Exam  Constitutional:  nad  HENT:  Head: Normocephalic and atraumatic.  Eyes: Conjunctivae are normal.  Neck: Neck supple.  Cardiovascular: Normal rate and regular rhythm.  Pulmonary/Chest: Effort normal and breath sounds normal.  Abdominal: Soft. Bowel sounds are normal.  Musculoskeletal: Normal range of motion.  Neurological: She is alert.  Skin: Skin is warm and dry.  Psychiatric:  Flat affect  Nursing note and vitals reviewed.    ED Treatments / Results  Labs (all labs ordered are listed, but only abnormal results are displayed) Labs Reviewed  COMPREHENSIVE METABOLIC PANEL - Abnormal; Notable for the following components:      Result Value   Sodium 133 (*)    Glucose, Bld 118 (*)    ALT 13 (*)    All other components within normal limits  TROPONIN I - Abnormal; Notable for the  following components:   Troponin I 0.06 (*)    All other components within normal limits  URINALYSIS, ROUTINE W REFLEX MICROSCOPIC - Abnormal; Notable for the following components:   APPearance HAZY (*)    Hgb urine dipstick MODERATE (*)    Nitrite POSITIVE (*)    Leukocytes, UA LARGE (*)    Bacteria, UA MANY (*)    Squamous Epithelial / LPF 0-5 (*)    All other components within normal limits  TROPONIN I - Abnormal; Notable for the following components:   Troponin I 0.07 (*)    All other components within normal limits  URINE CULTURE  CBC WITH DIFFERENTIAL/PLATELET    EKG  EKG Interpretation  Date/Time:  Thursday November 20 2017 10:01:55 EST Ventricular Rate:  111 PR Interval:    QRS Duration: 81 QT Interval:  356 QTC  Calculation: 484 R Axis:   30 Text Interpretation:  Sinus tachycardia Borderline T abnormalities, anterior leads Confirmed by Nat Christen [id number]) on 11/20/2017 12:17:13 PM       Radiology Dg Chest Port 1 View  Result Date: 11/20/2017 CLINICAL DATA:  Syncope.  No recent fall.  Prior history of falls. EXAM: PORTABLE CHEST 1 VIEW COMPARISON:  CT 10/09/2017.  Chest x-ray 10/08/2017 . FINDINGS: Left apex incompletely imaged. Mediastinum calcified right hilar lymph nodes again noted. Low lung volumes with mild bibasilar atelectasis and or scarring. COPD. Heart size normal. Degenerative changes thoracic spine with thoracic spine scoliosis. Multiple left-sided rib fractures, these may be old. IMPRESSION: 1. Low lung volumes with mild bibasilar subsegmental atelectasis and/or scarring . 2. Multiple left-sided rib fractures, these may be old. No pneumothorax. Electronically Signed   By: Marcello Moores  Register   On: 11/20/2017 10:37    Procedures Procedures (including critical care time)  Medications Ordered in ED Medications  sodium chloride 0.9 % bolus 500 mL (0 mLs Intravenous Hold 11/20/17 1257)  sodium chloride 0.9 % bolus 500 mL (0 mLs Intravenous Stopped 11/20/17 1217)    ALPRAZolam (XANAX) tablet 0.5 mg (0.5 mg Oral Given 11/20/17 1303)  cefTRIAXone (ROCEPHIN) 1 g in dextrose 5 % 50 mL IVPB (0 g Intravenous Stopped 11/20/17 1520)     Initial Impression / Assessment and Plan / ED Course  I have reviewed the triage vital signs and the nursing notes.  Pertinent labs & imaging results that were available during my care of the patient were reviewed by me and considered in my medical decision making (see chart for details).     Patient presents with syncope.  Lab workup reveals a urinary tract infection.  She was slightly tachycardic initially.  She has shown great improvement with IV fluids. Urinalysis shows evidence of infection.  IV Rocephin.  Will be able to treat as an outpatient with Keflex 500 mgorally.  These findings were discussed with the daughter who is a Equities trader.  She agrees with the treatment plan.  Final Clinical Impressions(s) / ED Diagnoses   Final diagnoses:  Syncope, unspecified syncope type  Urinary tract infection without hematuria, site unspecified    ED Discharge Orders        Ordered    cephALEXin (KEFLEX) 500 MG capsule  3 times daily     11/20/17 [place], Monet North, MD 11/22/17 [phone]

## 2017-11-20 NOTE — ED Triage Notes (Signed)
From [facility]- weakness. Hx falls. There for rehab for weakness. She was getting ready for therapy. She went unresponsive for 10 seconds, hands shook, came too. Second syncopal episode with hands shaking. EMS state not post ictal in any way. Hypotensive initially at 74/52. EMS on scene. BP 88/50 pale, diaphoretic. 500 cc bolus and now BP 94/60.  Baseline A&Ox4 and currently alert. Staff state she was complaining or ear pain and became dizzy before episode. CBG 201

## 2017-11-20 NOTE — Discharge Instructions (Signed)
I am concerned that the chronic use of opiates is hindering your appetite and making you nauseated.  Tests show a urinary tract infection.  Increase fluids.  Prescription for antibiotic.  You must eat and drink.

## 2017-11-20 NOTE — ED Notes (Signed)
Purewick placed. No urine output yet. Family encouraging her to use restroom.

## 2017-11-22 LAB — URINE CULTURE: Culture: >=100000 — AB

## 2017-11-23 ENCOUNTER — Telehealth: Payer: Self-pay

## 2017-11-23 NOTE — Telephone Encounter (Signed)
Post ED Visit - Positive Culture Follow-up  Culture report reviewed by antimicrobial stewardship pharmacist:  []  Elenor Quinones, Pharm.D. []  Heide Guile, Pharm.D., BCPS AQ-ID []  Parks Neptune, Pharm.D., BCPS []  Alycia Rossetti, Pharm.D., BCPS []  [place], [place].D., BCPS, AAHIVP []  Legrand Como, Pharm.D., BCPS, AAHIVP []  Salome Arnt, PharmD, BCPS []  Jalene Mullet, PharmD []  Vincenza Hews, PharmD, BCPS Georga Bora PharmD Positive urine culture Treated with Cephalexin, organism sensitive to the same and no further patient follow-up is required at this time.  Genia Del 11/23/2017, 9:58 AM

## 2017-11-24 ENCOUNTER — Non-Acute Institutional Stay (SKILLED_NURSING_FACILITY): Payer: Medicare Other | Admitting: Internal Medicine

## 2017-11-24 ENCOUNTER — Encounter: Payer: Self-pay | Admitting: Internal Medicine

## 2017-11-24 DIAGNOSIS — S72002A Fracture of unspecified part of neck of left femur, initial encounter for closed fracture: Secondary | ICD-10-CM | POA: Diagnosis not present

## 2017-11-24 DIAGNOSIS — F419 Anxiety disorder, unspecified: Secondary | ICD-10-CM | POA: Diagnosis not present

## 2017-11-24 DIAGNOSIS — I471 Supraventricular tachycardia, unspecified: Secondary | ICD-10-CM

## 2017-11-24 DIAGNOSIS — N390 Urinary tract infection, site not specified: Secondary | ICD-10-CM

## 2017-11-24 DIAGNOSIS — B9620 Unspecified Escherichia coli [E. coli] as the cause of diseases classified elsewhere: Secondary | ICD-10-CM

## 2017-11-24 DIAGNOSIS — J9621 Acute and chronic respiratory failure with hypoxia: Secondary | ICD-10-CM | POA: Diagnosis not present

## 2017-11-24 DIAGNOSIS — I480 Paroxysmal atrial fibrillation: Secondary | ICD-10-CM

## 2017-11-24 DIAGNOSIS — D62 Acute posthemorrhagic anemia: Secondary | ICD-10-CM | POA: Diagnosis not present

## 2017-11-24 DIAGNOSIS — K219 Gastro-esophageal reflux disease without esophagitis: Secondary | ICD-10-CM

## 2017-11-24 DIAGNOSIS — I5032 Chronic diastolic (congestive) heart failure: Secondary | ICD-10-CM

## 2017-11-24 DIAGNOSIS — E034 Atrophy of thyroid (acquired): Secondary | ICD-10-CM

## 2017-11-24 NOTE — Progress Notes (Signed)
Location:  [place] Room Number: [phone] Place of Service:  SNF [phone]) Raven Snyder. Raven Coil, MD  PCP: System, Pcp Not In Patient Care Team: System, Pcp Not In as PCP - General  Extended Emergency Contact Information Primary Emergency Contact: Raven Snyder Mobile Phone: [phone] Relation: Daughter  Allergies  Allergen Reactions  . Accupril [Quinapril Hcl]     Per Assisted Living Documents--reaction unknown  . Bactrim [Sulfamethoxazole-Trimethoprim]     Per Assisted Living Documents--reaction unknown  . Clindamycin/Lincomycin     Per Assisted Living Documents--reaction unknown  . Mold Extract [Trichophyton]     Per Assisted Living Documents--reaction unknown  . Morphine And Related     Per Assisted Living Documents--reaction unknown  . Nsaids Other (See Comments)    Makes interstitial cystis symptoms worse.   Marland Kitchen Penicillins     Per Assisted Living Documents--reaction unknown  . Shellfish Allergy     Per Assisted Living Documents--reaction unknown  . Tolmetin     Per Assisted Living Documents--reaction unknown  . Tramadol Nausea Only    Makes dizzy   . Yeast-Related Products     Per Assisted Living Documents--reaction unknown  . Zantac [Ranitidine Hcl]     Per Assisted Living Documents--reaction unknown  . Doxycycline Rash    Chief Complaint  Patient presents with  . Discharge Note    Discharged from SNF    HPI:  84 y.o. female with SVT, and hypothyroidism who was admitted to [facility] from 11/21-25 where she was treated for a femoral neck fracture, atrial fibrillation, hypoxemia, and Escherichia coli UTI. Patient was admitted to skilled nursing facility for OT/PT and is now ready to be discharged to home.     Past Medical History:  Diagnosis Date  . Anxiety   . colon ca dx'd 1999  . Hypothyroidism   . SVT (supraventricular tachycardia) (HCC)     Past Surgical History:  Procedure Laterality Date  . FRACTURE SURGERY    .  HIP PINNING,CANNULATED Left 10/08/2017   Procedure: CANNULATED HIP PINNING;  Surgeon: Gaynelle Arabian, MD;  Location: [facility] ORS;  Service: Orthopedics;  Laterality: Left;  . THYROIDECTOMY, PARTIAL       reports that  has never smoked. she has never used smokeless tobacco. She reports that she does not drink alcohol or use drugs. Social History   Socioeconomic History  . Marital status: Married    Spouse name: Kennith Center  . Number of children: 3  . Years of education: Not on file  . Highest education level: Not on file  Social Needs  . Financial resource strain: Not on file  . Food insecurity - worry: Not on file  . Food insecurity - inability: Not on file  . Transportation needs - medical: Not on file  . Transportation needs - non-medical: Not on file  Occupational History  . Not on file  Tobacco Use  . Smoking status: Never Smoker  . Smokeless tobacco: Never Used  Substance and Sexual Activity  . Alcohol use: No    Frequency: Never  . Drug use: No  . Sexual activity: No  Other Topics Concern  . Not on file  Social History Narrative   Admitted to [facility]   Married- Kennith Center   Never smoked   Alcohol none   DNR    Pertinent  Health Maintenance Due  Topic Date Due  . DEXA SCAN  03/19/1996  . PNA vac Low Risk Adult (1 of 2 -  PCV13) 03/19/1996  . INFLUENZA VACCINE  06/18/2017    Medications: Allergies as of 11/24/2017      Reactions   Accupril [quinapril Hcl]    Per Assisted Living Documents--reaction unknown   Bactrim [sulfamethoxazole-trimethoprim]    Per Assisted Living Documents--reaction unknown   Clindamycin/lincomycin    Per Assisted Living Documents--reaction unknown   Mold Extract [trichophyton]    Per Assisted Living Documents--reaction unknown   Morphine And Related    Per Assisted Living Documents--reaction unknown   Nsaids Other (See Comments)   Makes interstitial cystis symptoms worse.    Penicillins    Per Assisted Living  Documents--reaction unknown   Shellfish Allergy    Per Assisted Living Documents--reaction unknown   Tolmetin    Per Assisted Living Documents--reaction unknown   Tramadol Nausea Only   Makes dizzy    Yeast-related Products    Per Assisted Living Documents--reaction unknown   Zantac [ranitidine Hcl]    Per Assisted Living Documents--reaction unknown   Doxycycline Rash      Medication List        Accurate as of 11/24/17 11:59 PM. Always use your most recent med list.          acetaminophen 500 MG tablet Commonly known as:  TYLENOL Take 1,000 mg 2 (two) times daily by mouth.   ALPRAZolam 0.5 MG tablet Commonly known as:  XANAX Take 1 tablet (0.5 mg total) by mouth 4 (four) times daily.   amiodarone 100 MG tablet Commonly known as:  PACERONE Take 100 mg daily by mouth.   AZO-STANDARD 95 MG tablet Generic drug:  phenazopyridine Take 95 mg 2 (two) times daily as needed by mouth for pain.   cephALEXin 500 MG capsule Commonly known as:  KEFLEX Take 1 capsule (500 mg total) by mouth 3 (three) times daily.   docusate sodium 100 MG capsule Commonly known as:  COLACE Take 1 capsule (100 mg total) by mouth 2 (two) times daily.   ENSURE Take 1 Can by mouth 2 (two) times daily. One twice daily to support weight status   furosemide 20 MG tablet Commonly known as:  LASIX Take 1 tablet (20 mg total) by mouth daily.   HYDROcodone-acetaminophen 5-325 MG tablet Commonly known as:  NORCO/VICODIN Take 1 tablet by mouth every 6 (six) hours as needed (for pain. Take for 14days). Take one tablet every 6 hours as needed for pain. Stop 11/19/17   levothyroxine 100 MCG tablet Commonly known as:  SYNTHROID, LEVOTHROID Take 100 mcg daily before breakfast by mouth.   loratadine 10 MG tablet Commonly known as:  CLARITIN Take 10 mg daily as needed by mouth for allergies.   metoprolol tartrate 25 MG tablet Commonly known as:  LOPRESSOR Take 1 tablet (25 mg total) by mouth 2 (two) times  daily.   omeprazole 20 MG capsule Commonly known as:  PRILOSEC Take 20 mg daily by mouth.   polyethylene glycol packet Commonly known as:  MIRALAX / GLYCOLAX Take 17 g by mouth 2 (two) times daily as needed for mild constipation.   SENOKOT 8.6 MG tablet Generic drug:  senna Take 1 tablet by mouth daily as needed for constipation.   sodium chloride 1 g tablet Take 1 g 2 (two) times daily by mouth.        Vitals:   11/24/17 1205  BP: 132/78  Pulse: 78  Resp: 19  Temp: (!) 97.4 F (36.3 C)  TempSrc: Oral  SpO2: 98%  Weight: 125 lb (56.7 kg)  Height: 4\' 10"  (1.473 m)   Body mass index is 26.13 kg/m.  Physical Exam  GENERAL APPEARANCE: Alert, conversant. No acute distress.  HEENT: Unremarkable. RESPIRATORY: Breathing is even, unlabored. Lung sounds are clear   CARDIOVASCULAR: Heart RRR no murmurs, rubs or gallops. No peripheral edema.  GASTROINTESTINAL: Abdomen is soft, non-tender, not distended w/ normal bowel sounds.  NEUROLOGIC: Cranial nerves 2-12 grossly intact. Moves all extremities   Labs reviewed: Basic Metabolic Panel: Recent Labs    10/09/17 0624 10/10/17 0410 10/24/17 11/20/17 1005  NA 135 132* 130* 133*  K 3.7 3.7 3.8 3.7  CL 99* 101  --  101  CO2 25 23  --  23  GLUCOSE 141* 116*  --  118*  BUN 10 11 9 7   CREATININE 0.49 0.52 0.4* 0.67  CALCIUM 9.0 8.5*  --  9.1   No results found for: [facility] Liver Function Tests: Recent Labs    11/20/17 1005  AST 20  ALT 13*  ALKPHOS 86  BILITOT 0.7  PROT 6.9  ALBUMIN 3.5   No results for input(s): LIPASE, AMYLASE in the last 8760 hours. No results for input(s): AMMONIA in the last 8760 hours. CBC: Recent Labs    10/08/17 1230  10/10/17 0410 10/11/17 0536 10/24/17 11/20/17 1005  WBC 7.5   < > 7.4 6.7 5.9 6.7  NEUTROABS 5.4  --   --   --   --  4.9  HGB 15.5*   < > 11.8* 11.5* 12.8 13.3  HCT 44.6   < > 36.6 34.4* 39 39.1  MCV 93.9   < > 96.8 94.5  --  94.7  PLT 187   < > 148* 168 269  224   < > = values in this interval not displayed.   Lipid No results for input(s): CHOL, HDL, LDLCALC, TRIG in the last 8760 hours. Cardiac Enzymes: Recent Labs    11/20/17 1005 11/20/17 1336  TROPONINI 0.06* 0.07*   BNP: Recent Labs    10/09/17 0624  BNP 230.6*   CBG: No results for input(s): GLUCAP in the last 8760 hours.  Procedures and Imaging Studies During Stay: Dg Chest Port 1 View  Result Date: 11/20/2017 CLINICAL DATA:  Syncope.  No recent fall.  Prior history of falls. EXAM: PORTABLE CHEST 1 VIEW COMPARISON:  CT 10/09/2017.  Chest x-ray 10/08/2017 . FINDINGS: Left apex incompletely imaged. Mediastinum calcified right hilar lymph nodes again noted. Low lung volumes with mild bibasilar atelectasis and or scarring. COPD. Heart size normal. Degenerative changes thoracic spine with thoracic spine scoliosis. Multiple left-sided rib fractures, these may be old. IMPRESSION: 1. Low lung volumes with mild bibasilar subsegmental atelectasis and/or scarring . 2. Multiple left-sided rib fractures, these may be old. No pneumothorax. Electronically Signed   By: Marcello Moores  Register   On: 11/20/2017 10:37    Assessment/Plan:   Left displaced femoral neck fracture (HCC)  Escherichia coli urinary tract infection  Acute blood loss as cause of postoperative anemia  Acute on chronic respiratory failure with hypoxia (HCC)  Chronic diastolic congestive heart failure (HCC)  SVT (supraventricular tachycardia) (HCC)  AF (paroxysmal atrial fibrillation) ([place])  Hypothyroidism due to acquired atrophy of thyroid  Gastroesophageal reflux disease without esophagitis  Anxiety   Patient is being discharged with the following home health services:  OT/PT  Patient is being discharged with the following durable medical equipment:  None  Patient has been advised to f/u with their PCP in 1-2 weeks to bring them up to  date on their rehab stay.  Social services at facility was responsible for  arranging this appointment.  Pt was provided with a 30 day supply of prescriptions for medications and refills must be obtained from their PCP.  For controlled substances, a more limited supply may be provided adequate until PCP appointment only.  Medications have been reconciled.  Time spent greater than 30 minutes;> 50% of time with patient was spent reviewing records, labs, tests and studies, counseling and developing plan of care  Raven Snyder. Raven Coil, MD

## 2017-11-30 ENCOUNTER — Encounter: Payer: Self-pay | Admitting: Internal Medicine

## 2017-12-10 ENCOUNTER — Encounter: Payer: Self-pay | Admitting: Internal Medicine

## 2017-12-10 NOTE — Assessment & Plan Note (Signed)
No complaints of reflux; continue Prilosec 20 mg by mouth daily

## 2017-12-10 NOTE — Assessment & Plan Note (Signed)
Stable; continue amiodarone 100 mg by mouth daily and metoprolol 25 mg by mouth twice a day for rate; patient is not on any prophylaxis

## 2017-12-10 NOTE — Assessment & Plan Note (Signed)
Has been stable; continue metoprolol 25 mg by mouth twice a day, and Lasix

## 2018-03-24 ENCOUNTER — Telehealth: Payer: Self-pay

## 2018-03-24 NOTE — Telephone Encounter (Signed)
SENT REFERRAL TO SCHEDULING 

## 2018-04-16 ENCOUNTER — Institutional Professional Consult (permissible substitution): Payer: Medicare Other | Admitting: Cardiology

## 2018-05-25 ENCOUNTER — Other Ambulatory Visit: Payer: Self-pay | Admitting: Surgery

## 2018-05-25 ENCOUNTER — Telehealth: Payer: Self-pay

## 2018-05-25 DIAGNOSIS — K432 Incisional hernia without obstruction or gangrene: Secondary | ICD-10-CM

## 2018-05-25 NOTE — Telephone Encounter (Signed)
   [facility] Pre-operative Risk Assessment    Request for surgical clearance:  1. What type of surgery is being performed? "hernia"-- hernia repair?    2. When is this surgery scheduled? Pending    3. What type of clearance is required (medical clearance vs. Pharmacy clearance to hold med vs. Both)?   4. Are there any medications that need to be held prior to surgery and how long? Not listed   5. Practice name and name of physician performing surgery? [facility], phys not listed   6. What is your office phone number [phone]    7.   What is your office fax number [phone] attn Armen Glo Herring   8.   Anesthesia type (None, local, MAC, general) ? general   Joaquim Lai 05/25/2018, 4:58 PM  _________________________________________________________________   (provider comments below)

## 2018-05-26 NOTE — Telephone Encounter (Signed)
Spoke with Raven Snyder and scheduled a new Raven Snyder pre-op clearance appointment with [name] on 06/23/18 at 1:00 pm. Raven Snyder verbalized understanding.

## 2018-05-26 NOTE — Telephone Encounter (Signed)
Spoke with [facility] and pt does not have a cardiologist. Will need a new pt cardiology evaluation due to having SVT in order to have future hernia surgery.

## 2018-05-26 NOTE — Telephone Encounter (Signed)
Pt is having umbilical hernia repair surgery.

## 2018-05-26 NOTE — Telephone Encounter (Signed)
Needs new pt visit for surgical clearance.  Thanks.

## 2018-05-26 NOTE — Telephone Encounter (Signed)
We have never seen this patient.  Please check with [facility] if they would like new pt visit for cardiology eval?  Also what type of hernia repair.    Thank you.

## 2018-06-12 ENCOUNTER — Ambulatory Visit
Admission: RE | Admit: 2018-06-12 | Discharge: 2018-06-12 | Disposition: A | Discharge status: Home / Self Care | Payer: Medicare Other | Source: Ambulatory Visit | Attending: Surgery | Admitting: Surgery

## 2018-06-12 DIAGNOSIS — K432 Incisional hernia without obstruction or gangrene: Secondary | ICD-10-CM

## 2018-06-12 MED ORDER — IOPAMIDOL (ISOVUE-300) INJECTION 61%
80.0000 mL | Freq: Once | INTRAVENOUS | Status: AC | PRN
  Administered 2018-06-12: 80 mL via INTRAVENOUS

## 2018-06-22 ENCOUNTER — Ambulatory Visit: Payer: Self-pay | Admitting: Surgery

## 2018-06-22 NOTE — H&P (View-Only) (Signed)
History of Present Illness Raven Snyder. Raven Sanjuan MD; 06/22/2018 1:36 PM) The patient is a 84 year old female who presents with an incisional hernia. Referred by Dr. Reymundo Poll for recurrent ventral hernia  This is an 84 year old female who is a resident of a nursing home and who is wheelchair bound who presents with a recurrent hernia. She was evaluated by Dr. Reymundo Poll who recommended that she seek surgical evaluation for her hernia. The patient had a right hemicolectomy for colon cancer in 2000. She did undergo chemotherapy. This was not performed in this area. In 2000 for she developed a ventral incisional hernia. This was apparently repaired laparoscopically at [facility]. The patient did well with this but over the last month has developed uncomfortable swelling in her incision around the umbilicus. This has become larger. It remains reducible. She denies any obstructive symptoms. She has not had any imaging of this area. She has not had any follow-up CT scans in the last 10 years. She has not had any colon cancer surveillance in the last 10 years.  The patient has had issues with SVT but is not currently seen a cardiologist here in [place]. She and her family members desire to move her care to [facility] as she is now living here. She did have a left hip fracture after a fall in November 2018. This was pinned by Dr. Wynelle Link. The patient was discharged to rehabilitation after her hip surgery. She did not have any apparent anesthetic complications.  We obtained a CT scan that showed a recurrent ventral hernia containing the anterior wall of her transverse colon.  CLINICAL DATA: 84 year old female with a history of colon cancer status post resection and ventral hernia repair. Recurrent ventral hernia with pain for the past several months. EXAM: CT ABDOMEN AND PELVIS WITH CONTRAST TECHNIQUE: Multidetector CT imaging of the abdomen and pelvis was performed using the  standard protocol following bolus administration of intravenous contrast. CONTRAST: 34mL ISOVUE-300 IOPAMIDOL (ISOVUE-300) INJECTION 61% COMPARISON: Prior CT scan of the chest 10/09/2017 FINDINGS: Lower chest: Elevation of the anterior aspect of the right hemidiaphragm. Scattered areas of pleuroparenchymal scarring versus chronic atelectasis in the lower lungs. No suspicious pulmonary nodule or region of focal consolidation. The heart is normal in size. No pericardial effusion. Calcifications present throughout the coronary arteries. Partially visualized calcification of the aortic valve and mitral valve annulus. Small hiatal hernia. Hepatobiliary: Normal hepatic contours and morphology. Stable subcentimeter low-attenuation lesion in the periphery of the right hepatic lobe dating back to 10/09/2017 consistent with a small simple cyst. No solid lesion. Gallbladder is unremarkable. No intra or extrahepatic biliary ductal dilatation. Pancreas: Diffuse fatty atrophy of the pancreas. No inflammatory changes or mass lesion. Spleen: Scattered calcifications throughout the splenic parenchyma consistent with sequelae of old granulomatous disease. Adrenals/Urinary Tract: The adrenal glands are normal in appearance. No evidence of hydronephrosis, nephrolithiasis or enhancing renal mass. Multiple circumscribed water attenuation cystic lesions are present consistent with benign cysts. The largest measures 3.5 cm exophytic from the anterior lower pole of the left kidney. Stomach/Bowel: Colonic diverticular disease without CT evidence of active inflammation. Surgical changes of prior right hemicolectomy with widely patent ileocolonic anastomosis. There is borderline dilatation of multiple loops of small bowel in the mid abdomen. The degree of dilatation appears less conspicuous on the delayed images suggesting that this represents transient changes related to the passage of oral contrast. There is no focal  transition point. Vascular/Lymphatic: Atherosclerotic calcifications throughout the abdominal aorta. No evidence of  aneurysm. Bulky calcifications are present at the origins of the celiac artery and SMA. Retroaortic left renal vein noted incidentally. Reproductive: Uterus and bilateral adnexa are unremarkable. Other: Recurrent ventral abdominal hernia with herniation of the anterior wall of the transverse colon. No evidence of colonic obstruction or focal wall thickening. No ascites. 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 ORIF of a now healed right femoral neck fracture. No acute osseous abnormality. Chronic L1 and L2 compression fractures. Additionally, there is levoconvex scoliosis centered at the L2 vertebral body with multi level associated degenerative disc disease and bilateral lower lumbar facet arthropathy worse on the left than the right. Additionally, there is grade 1 anterolisthesis of L4 on L5. IMPRESSION: 1. Midline ventral abdominal wall hernia containing the anterior wall of a segment of the transverse colon. No evidence of associated colonic wall thickening or obstruction. 2. Surgical changes of prior right hemicolectomy with patent ileocolonic anastomosis. 3. Scattered bilateral lower lung pleuroparenchymal scarring and/or atelectasis. 4. Coronary artery calcifications. 5. Aortic valve calcifications. 6. Colonic diverticular disease without CT evidence of active inflammation. 7. Aortic Atherosclerosis (ICD10-170.0) 8. Chronic L1 and L2 compression fractures. 9. 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 10. Levoconvex scoliosis of the lumbar spine centered at L2 with associated multilevel degenerative disc disease and left greater than right lower lumbar facet arthropathy. 11. Grade 1 anterolisthesis of L4 on L5. 12. Left renal cysts. Electronically Signed By: Raven Snyder M.D. On: 06/12/2018 11:12    We received cardiac clearance from her [facility] Cardiologist - Dr. Idolina Snyder (intermediate risk for intermediate risk surgery). She continues to have discomfort around her hernia. She feels that it may be slightly larger. She will likely have this area repaired. She is nonambulatory but does transfer from wheelchair to bed.    Problem List/Past Medical Raven Key K. Money Mckeithan, MD; 06/22/2018 1:36 PM) RECURRENT VENTRAL HERNIA (K43.2)  Past Surgical History Raven Key K. Vetra Shinall, MD; 06/22/2018 1:36 PM) Breast Biopsy Bilateral. Cataract Surgery Bilateral. Colon Removal - Partial Foot Surgery Bilateral. Hip Surgery Bilateral. Knee Surgery Bilateral. Oral Surgery Thyroid Surgery Tonsillectomy Ventral / Umbilical Hernia Surgery Bilateral.  Diagnostic Studies History (Raven Menor K. Magie Ciampa, MD; 06/22/2018 1:36 PM) Colonoscopy 5-10 years ago Mammogram >3 years ago Pap Smear >5 years ago  Allergies (Raven Snyder, [place]; 06/22/2018 10:57 AM) Penicillins NSAIDs CLINDAMYCIN TOLMETIN Zantac *ULCER DRUGS/ANTISPASMODICS/ANTICHOLINERGICS* Accupril *ANTIHYPERTENSIVES* Bactrim *ANTI-INFECTIVE AGENTS - MISC.* Allergies Reconciled  Medication History (Raven Snyder, [place]; 06/22/2018 10:58 AM) Nystatin (100000 UNIT/GM Cream, External) Active. Acetaminophen (500MG  Tablet, Oral) Active. Loratadine (10MG  Tablet, Oral) Active. Amiodarone HCl (100MG  Tablet, Oral) Active. Celecoxib (100MG  Capsule, Oral) Active. Diclofenac Sodium (1% Gel, External) Active. Levothyroxine Sodium (100MCG Tablet, Oral) Active. Melatonin (1MG  Capsule, Oral) Active. Mirtazapine (15MG  Tablet, Oral) Active. Omeprazole (20MG  Capsule DR, Oral) Active. Psyllium (0.52GM Capsule, Oral) Active. Senna (8.6MG  Tablet, Oral) Active. Sertraline HCl (50MG  Tablet, Oral) Active. Sodium Chloride (1GM Tablet, Oral) Active. Stool Softener (250MG  Capsule, Oral) Active. Artificial Tears (1.4% Solution,  Ophthalmic) Active. Deep Sea Nasal Spray (0.65% Solution, Nasal) Active. Lasix (20MG  Tablet, Oral) Active. Hydrocortisone Acetate (25MG  Suppository, Rectal) Active. Aspercreme w/Lidocaine (4% Cream, External) Active. Medications Reconciled  Social History Raven Snyder. Raven Dingley, MD; 06/22/2018 1:36 PM) Alcohol use Remotely quit alcohol use. Caffeine use Coffee. No drug use Tobacco use Former smoker.  Family History Raven Snyder. Raven Dinkel, MD; 06/22/2018 1:36 PM) Alcohol Abuse Brother. Arthritis Brother, Daughter, Mother, Sister, Son. Bleeding disorder Son. Cerebrovascular Accident Mother. Colon Polyps Daughter,  Mother, Sister, Son. Depression Brother, Daughter, Sister, Son. Diabetes Mellitus Brother. Heart Disease Brother. Kidney Disease Son. Respiratory Condition Brother, Mother, Son.  Pregnancy / Birth History Raven Snyder. Raven Sheldon, MD; 06/22/2018 1:36 PM) Age at menarche 69 years. Age of menopause <47 Gravida 3 Length (months) of breastfeeding 3-6 Maternal age 60-20 Para 3  Other Problems Raven Snyder. Raven Mccaster, MD; 06/22/2018 1:36 PM) Anxiety Disorder Bladder Problems Colon Cancer Congestive Heart Failure Depression Gastroesophageal Reflux Disease Oophorectomy Bilateral. Other disease, cancer, significant illness Thyroid Disease     Review of Systems Raven Key K. Ameen Mostafa MD; 06/22/2018 1:37 PM)  Note: General Present- Appetite Loss and Weight Loss. Not Present- Chills, Fatigue, Fever, Night Sweats and Weight Gain. HEENT Present- Hearing Loss, Seasonal Allergies, Sore Throat and Wears glasses/contact lenses. Not Present- Earache, Hoarseness, Nose Bleed, Oral Ulcers, Ringing in the Ears, Sinus Pain, Visual Disturbances and Yellow Eyes. Cardiovascular Present- Swelling of Extremities. Not Present- Chest Pain, Difficulty Breathing Lying Down, Leg Cramps, Palpitations, Rapid Heart Rate and Shortness of Breath. Gastrointestinal Present- Constipation and Nausea.  Not Present- Abdominal Pain, Bloating, Bloody Stool, Change in Bowel Habits, Chronic diarrhea, Difficulty Swallowing, Excessive gas, Gets full quickly at meals, Hemorrhoids, Indigestion, Rectal Pain and Vomiting. Female Genitourinary Present- Nocturia and Painful Urination. Not Present- Frequency, Pelvic Pain and Urgency. Musculoskeletal Present- Back Pain, Joint Pain, Muscle Pain and Muscle Weakness. Not Present- Joint Stiffness and Swelling of Extremities. Neurological Present- Decreased Memory, Numbness, Tremor, Trouble walking and Weakness. Not Present- Fainting, Headaches, Seizures and Tingling. Psychiatric Present- Anxiety and Depression. Not Present- Bipolar, Change in Sleep Pattern, Fearful and Frequent crying. Endocrine Present- Cold Intolerance and Hot flashes. Not Present- Excessive Hunger, Hair Changes, Heat Intolerance and New Diabetes.   Vitals (Raven Snyder RMA; 06/22/2018 10:57 AM) 06/22/2018 10:57 AM Weight: 119 lb Height: 58in Body Surface Area: 1.46 m Body Mass Index: 24.87 kg/m  Pulse: 82 (Regular)  BP: 102/76 (Sitting, Left Arm, Standard)      Physical Exam Raven Key K. Dazani Norby MD; 06/22/2018 1:38 PM)  The physical exam findings are as follows: Note:WDWN in NAD Eyes: Pupils equal, round; sclera anicteric HENT: Oral mucosa moist; good dentition Neck: No masses palpated, no thyromegaly Lungs: CTA bilaterally; normal respiratory effort CV: Regular rate and rhythm; no murmurs; extremities well-perfused with no edema Abd: +bowel sounds, soft, non-tender, no palpable organomegaly; healed vertical midline incision. Around the umbilicus there is a 4 cm bulge. This is reducible. Skin: Warm, dry; no sign of jaundice Psychiatric - alert and oriented x 4; calm mood and affect    Assessment & Plan Raven Key K. Maysie Parkhill MD; 06/22/2018 11:35 AM)  RECURRENT VENTRAL HERNIA (K43.2)  Current Plans Schedule for Surgery - recurrent ventral hernia repair with mesh. The  surgical procedure has been discussed with the patient. Potential risks, benefits, alternative treatments, and expected outcomes have been explained. All of the patient's questions at this time have been answered. The likelihood of reaching the patient's treatment goal is good. The patient understand the proposed surgical procedure and wishes to proceed.  Raven Snyder. Georgette Dover, MD, [facility] Surgery  General/ Trauma Surgery  06/22/2018 1:38 PM

## 2018-06-22 NOTE — H&P (Signed)
History of Present Illness Raven Snyder. Raven Delmont MD; 06/22/2018 1:36 PM) The patient is a 84 year old female who presents with an incisional hernia. Referred by Dr. Reymundo Poll for recurrent ventral hernia  This is an 84 year old female who is a resident of a nursing home and who is wheelchair bound who presents with a recurrent hernia. She was evaluated by Dr. Reymundo Poll who recommended that she seek surgical evaluation for her hernia. The patient had a right hemicolectomy for colon cancer in 2000. She did undergo chemotherapy. This was not performed in this area. In 2000 for she developed a ventral incisional hernia. This was apparently repaired laparoscopically at [facility]. The patient did well with this but over the last month has developed uncomfortable swelling in her incision around the umbilicus. This has become larger. It remains reducible. She denies any obstructive symptoms. She has not had any imaging of this area. She has not had any follow-up CT scans in the last 10 years. She has not had any colon cancer surveillance in the last 10 years.  The patient has had issues with SVT but is not currently seen a cardiologist here in [place]. She and her family members desire to move her care to [facility] as she is now living here. She did have a left hip fracture after a fall in November 2018. This was pinned by Dr. Wynelle Link. The patient was discharged to rehabilitation after her hip surgery. She did not have any apparent anesthetic complications.  We obtained a CT scan that showed a recurrent ventral hernia containing the anterior wall of her transverse colon.  CLINICAL DATA: 84 year old female with a history of colon cancer status post resection and ventral hernia repair. Recurrent ventral hernia with pain for the past several months. EXAM: CT ABDOMEN AND PELVIS WITH CONTRAST TECHNIQUE: Multidetector CT imaging of the abdomen and pelvis was performed using the  standard protocol following bolus administration of intravenous contrast. CONTRAST: 34mL ISOVUE-300 IOPAMIDOL (ISOVUE-300) INJECTION 61% COMPARISON: Prior CT scan of the chest 10/09/2017 FINDINGS: Lower chest: Elevation of the anterior aspect of the right hemidiaphragm. Scattered areas of pleuroparenchymal scarring versus chronic atelectasis in the lower lungs. No suspicious pulmonary nodule or region of focal consolidation. The heart is normal in size. No pericardial effusion. Calcifications present throughout the coronary arteries. Partially visualized calcification of the aortic valve and mitral valve annulus. Small hiatal hernia. Hepatobiliary: Normal hepatic contours and morphology. Stable subcentimeter low-attenuation lesion in the periphery of the right hepatic lobe dating back to 10/09/2017 consistent with a small simple cyst. No solid lesion. Gallbladder is unremarkable. No intra or extrahepatic biliary ductal dilatation. Pancreas: Diffuse fatty atrophy of the pancreas. No inflammatory changes or mass lesion. Spleen: Scattered calcifications throughout the splenic parenchyma consistent with sequelae of old granulomatous disease. Adrenals/Urinary Tract: The adrenal glands are normal in appearance. No evidence of hydronephrosis, nephrolithiasis or enhancing renal mass. Multiple circumscribed water attenuation cystic lesions are present consistent with benign cysts. The largest measures 3.5 cm exophytic from the anterior lower pole of the left kidney. Stomach/Bowel: Colonic diverticular disease without CT evidence of active inflammation. Surgical changes of prior right hemicolectomy with widely patent ileocolonic anastomosis. There is borderline dilatation of multiple loops of small bowel in the mid abdomen. The degree of dilatation appears less conspicuous on the delayed images suggesting that this represents transient changes related to the passage of oral contrast. There is no focal  transition point. Vascular/Lymphatic: Atherosclerotic calcifications throughout the abdominal aorta. No evidence of  aneurysm. Bulky calcifications are present at the origins of the celiac artery and SMA. Retroaortic left renal vein noted incidentally. Reproductive: Uterus and bilateral adnexa are unremarkable. Other: Recurrent ventral abdominal hernia with herniation of the anterior wall of the transverse colon. No evidence of colonic obstruction or focal wall thickening. No ascites. 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 ORIF of a now healed right femoral neck fracture. No acute osseous abnormality. Chronic L1 and L2 compression fractures. Additionally, there is levoconvex scoliosis centered at the L2 vertebral body with multi level associated degenerative disc disease and bilateral lower lumbar facet arthropathy worse on the left than the right. Additionally, there is grade 1 anterolisthesis of L4 on L5. IMPRESSION: 1. Midline ventral abdominal wall hernia containing the anterior wall of a segment of the transverse colon. No evidence of associated colonic wall thickening or obstruction. 2. Surgical changes of prior right hemicolectomy with patent ileocolonic anastomosis. 3. Scattered bilateral lower lung pleuroparenchymal scarring and/or atelectasis. 4. Coronary artery calcifications. 5. Aortic valve calcifications. 6. Colonic diverticular disease without CT evidence of active inflammation. 7. Aortic Atherosclerosis (ICD10-170.0) 8. Chronic L1 and L2 compression fractures. 9. 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 10. Levoconvex scoliosis of the lumbar spine centered at L2 with associated multilevel degenerative disc disease and left greater than right lower lumbar facet arthropathy. 11. Grade 1 anterolisthesis of L4 on L5. 12. Left renal cysts. Electronically Signed By: Jacqulynn Cadet M.D. On: 06/12/2018 11:12    We received cardiac clearance from her [facility] Cardiologist - Dr. Idolina Primer (intermediate risk for intermediate risk surgery). She continues to have discomfort around her hernia. She feels that it may be slightly larger. She will likely have this area repaired. She is nonambulatory but does transfer from wheelchair to bed.    Problem List/Past Medical Rodman Key K. Roneka Gilpin, MD; 06/22/2018 1:36 PM) RECURRENT VENTRAL HERNIA (K43.2)  Past Surgical History Rodman Key K. Olar Santini, MD; 06/22/2018 1:36 PM) Breast Biopsy Bilateral. Cataract Surgery Bilateral. Colon Removal - Partial Foot Surgery Bilateral. Hip Surgery Bilateral. Knee Surgery Bilateral. Oral Surgery Thyroid Surgery Tonsillectomy Ventral / Umbilical Hernia Surgery Bilateral.  Diagnostic Studies History (Takari Lundahl K. Izaac Reisig, MD; 06/22/2018 1:36 PM) Colonoscopy 5-10 years ago Mammogram >3 years ago Pap Smear >5 years ago  Allergies (Tanisha A. Owens Shark, [place]; 06/22/2018 10:57 AM) Penicillins NSAIDs CLINDAMYCIN TOLMETIN Zantac *ULCER DRUGS/ANTISPASMODICS/ANTICHOLINERGICS* Accupril *ANTIHYPERTENSIVES* Bactrim *ANTI-INFECTIVE AGENTS - MISC.* Allergies Reconciled  Medication History (Tanisha A. Brown, [place]; 06/22/2018 10:58 AM) Nystatin (100000 UNIT/GM Cream, External) Active. Acetaminophen (500MG  Tablet, Oral) Active. Loratadine (10MG  Tablet, Oral) Active. Amiodarone HCl (100MG  Tablet, Oral) Active. Celecoxib (100MG  Capsule, Oral) Active. Diclofenac Sodium (1% Gel, External) Active. Levothyroxine Sodium (100MCG Tablet, Oral) Active. Melatonin (1MG  Capsule, Oral) Active. Mirtazapine (15MG  Tablet, Oral) Active. Omeprazole (20MG  Capsule DR, Oral) Active. Psyllium (0.52GM Capsule, Oral) Active. Senna (8.6MG  Tablet, Oral) Active. Sertraline HCl (50MG  Tablet, Oral) Active. Sodium Chloride (1GM Tablet, Oral) Active. Stool Softener (250MG  Capsule, Oral) Active. Artificial Tears (1.4% Solution,  Ophthalmic) Active. Deep Sea Nasal Spray (0.65% Solution, Nasal) Active. Lasix (20MG  Tablet, Oral) Active. Hydrocortisone Acetate (25MG  Suppository, Rectal) Active. Aspercreme w/Lidocaine (4% Cream, External) Active. Medications Reconciled  Social History Raven Snyder. Demarcus Thielke, MD; 06/22/2018 1:36 PM) Alcohol use Remotely quit alcohol use. Caffeine use Coffee. No drug use Tobacco use Former smoker.  Family History Raven Snyder. Darcy Cordner, MD; 06/22/2018 1:36 PM) Alcohol Abuse Brother. Arthritis Brother, Daughter, Mother, Sister, Son. Bleeding disorder Son. Cerebrovascular Accident Mother. Colon Polyps Daughter,  Mother, Sister, Son. Depression Brother, Daughter, Sister, Son. Diabetes Mellitus Brother. Heart Disease Brother. Kidney Disease Son. Respiratory Condition Brother, Mother, Son.  Pregnancy / Birth History Raven Snyder. Mirza Fessel, MD; 06/22/2018 1:36 PM) Age at menarche 70 years. Age of menopause <47 Gravida 3 Length (months) of breastfeeding 3-6 Maternal age 94-20 Para 3  Other Problems Raven Snyder. Brynn Mulgrew, MD; 06/22/2018 1:36 PM) Anxiety Disorder Bladder Problems Colon Cancer Congestive Heart Failure Depression Gastroesophageal Reflux Disease Oophorectomy Bilateral. Other disease, cancer, significant illness Thyroid Disease     Review of Systems Rodman Key K. Denitra Donaghey MD; 06/22/2018 1:37 PM)  Note: General Present- Appetite Loss and Weight Loss. Not Present- Chills, Fatigue, Fever, Night Sweats and Weight Gain. HEENT Present- Hearing Loss, Seasonal Allergies, Sore Throat and Wears glasses/contact lenses. Not Present- Earache, Hoarseness, Nose Bleed, Oral Ulcers, Ringing in the Ears, Sinus Pain, Visual Disturbances and Yellow Eyes. Cardiovascular Present- Swelling of Extremities. Not Present- Chest Pain, Difficulty Breathing Lying Down, Leg Cramps, Palpitations, Rapid Heart Rate and Shortness of Breath. Gastrointestinal Present- Constipation and Nausea.  Not Present- Abdominal Pain, Bloating, Bloody Stool, Change in Bowel Habits, Chronic diarrhea, Difficulty Swallowing, Excessive gas, Gets full quickly at meals, Hemorrhoids, Indigestion, Rectal Pain and Vomiting. Female Genitourinary Present- Nocturia and Painful Urination. Not Present- Frequency, Pelvic Pain and Urgency. Musculoskeletal Present- Back Pain, Joint Pain, Muscle Pain and Muscle Weakness. Not Present- Joint Stiffness and Swelling of Extremities. Neurological Present- Decreased Memory, Numbness, Tremor, Trouble walking and Weakness. Not Present- Fainting, Headaches, Seizures and Tingling. Psychiatric Present- Anxiety and Depression. Not Present- Bipolar, Change in Sleep Pattern, Fearful and Frequent crying. Endocrine Present- Cold Intolerance and Hot flashes. Not Present- Excessive Hunger, Hair Changes, Heat Intolerance and New Diabetes.   Vitals (Tanisha A. Brown RMA; 06/22/2018 10:57 AM) 06/22/2018 10:57 AM Weight: 119 lb Height: 58in Body Surface Area: 1.46 m Body Mass Index: 24.87 kg/m  Pulse: 82 (Regular)  BP: 102/76 (Sitting, Left Arm, Standard)      Physical Exam Rodman Key K. Sedrick Tober MD; 06/22/2018 1:38 PM)  The physical exam findings are as follows: Note:WDWN in NAD Eyes: Pupils equal, round; sclera anicteric HENT: Oral mucosa moist; good dentition Neck: No masses palpated, no thyromegaly Lungs: CTA bilaterally; normal respiratory effort CV: Regular rate and rhythm; no murmurs; extremities well-perfused with no edema Abd: +bowel sounds, soft, non-tender, no palpable organomegaly; healed vertical midline incision. Around the umbilicus there is a 4 cm bulge. This is reducible. Skin: Warm, dry; no sign of jaundice Psychiatric - alert and oriented x 4; calm mood and affect    Assessment & Plan Rodman Key K. Keryl Gholson MD; 06/22/2018 11:35 AM)  RECURRENT VENTRAL HERNIA (K43.2)  Current Plans Schedule for Surgery - recurrent ventral hernia repair with mesh. The  surgical procedure has been discussed with the patient. Potential risks, benefits, alternative treatments, and expected outcomes have been explained. All of the patient's questions at this time have been answered. The likelihood of reaching the patient's treatment goal is good. The patient understand the proposed surgical procedure and wishes to proceed.  Raven Snyder. Georgette Dover, MD, [facility]  General/ Trauma Surgery  06/22/2018 1:38 PM

## 2018-06-23 ENCOUNTER — Ambulatory Visit: Payer: Medicare Other | Admitting: Physician Assistant

## 2018-06-30 NOTE — Pre-Procedure Instructions (Addendum)
Raven Snyder  07/01/2018     No Pharmacies Listed   Your procedure is scheduled on Thurs., Aug. 27, 2019 from 7:30AM-9:00AM  Report to [facility] Admitting Entrance "A" at 5:30AM  Call this number if you have problems the morning of surgery:  [phone]   Remember:  Do not eat after midnight on Aug. 26th  You may drink clear liquids until 3 hours (4:30AM) prior to surgery.  Clear liquids allowed are: Water and Gatorade    Take these medicines the morning of surgery with A SIP OF WATER: Amiodarone (PACERONE), Levothyroxine (SYNTHROID, LEVOTHROID), Omeprazole (PRILOSEC), Sertraline (ZOLOFT), and Fluticasone (FLONASE)   If needed Acetaminophen (TYLENOL), Eye drops, and Nasal spray  7 days before surgery (8/15), stop taking all Other Aspirin Products, Vitamins, Fish oils, and Herbal medications. Also stop all NSAIDS i.e. Advil, Ibuprofen, Motrin, Aleve, Anaprox, Naproxen, BC, Goody Powders, and all Supplements. Including: Celecoxib (CELEBREX)    Do not wear jewelry, make-up or nail polish.  Do not wear lotions, powders, or perfumes, or deodorant.  Do not shave 48 hours prior to surgery.    Do not bring valuables to the hospital.  [facility] is not responsible for any belongings or valuables.  Contacts, dentures or bridgework may not be worn into surgery.  Leave your suitcase in the car.  After surgery it may be brought to your room.  For patients admitted to the hospital, discharge time will be determined by your treatment team.  Patients discharged the day of surgery will not be allowed to drive home.   Special instructions:   [place]- Preparing For Surgery  Before surgery, you can play an important role. Because skin is not sterile, your skin needs to be as free of germs as possible. You can reduce the number of germs on your skin by washing with CHG (chlorahexidine gluconate) Soap before surgery.  CHG is an antiseptic cleaner which kills germs and bonds  with the skin to continue killing germs even after washing.    Oral Hygiene is also important to reduce your risk of infection.  Remember - BRUSH YOUR TEETH THE MORNING OF SURGERY WITH YOUR REGULAR TOOTHPASTE  Please do not use if you have an allergy to CHG or antibacterial soaps. If your skin becomes reddened/irritated stop using the CHG.  Do not shave (including legs and underarms) for at least 48 hours prior to first CHG shower. It is OK to shave your face.  Please follow these instructions carefully.   1. Shower the NIGHT BEFORE SURGERY and the MORNING OF SURGERY with CHG.   2. If you chose to wash your hair, wash your hair first as usual with your normal shampoo.  3. After you shampoo, rinse your hair and body thoroughly to remove the shampoo.  4. Use CHG as you would any other liquid soap. You can apply CHG directly to the skin and wash gently with a scrungie or a clean washcloth.   5. Apply the CHG Soap to your body ONLY FROM THE NECK DOWN.  Do not use on open wounds or open sores. Avoid contact with your eyes, ears, mouth and genitals (private parts). Wash Face and genitals (private parts)  with your normal soap.  6. Wash thoroughly, paying special attention to the area where your surgery will be performed.  7. Thoroughly rinse your body with warm water from the neck down.  8. DO NOT shower/wash with your normal soap after using and rinsing off the  CHG Soap.  9. Pat yourself dry with a CLEAN TOWEL.  10. Wear CLEAN PAJAMAS to bed the night before surgery, wear comfortable clothes the morning of surgery  11. Place CLEAN SHEETS on your bed the night of your first shower and DO NOT SLEEP WITH PETS.  Day of Surgery:  Do not apply any deodorants/lotions.  Please wear clean clothes to the hospital/surgery center.   Remember to brush your teeth WITH YOUR REGULAR TOOTHPASTE.  Please read over the following fact sheets that you were given. Pain Booklet, Coughing and Deep Breathing  and Surgical Site Infection Prevention

## 2018-07-01 ENCOUNTER — Encounter (HOSPITAL_COMMUNITY): Payer: Self-pay

## 2018-07-01 ENCOUNTER — Encounter (HOSPITAL_COMMUNITY)
Admission: RE | Admit: 2018-07-01 | Discharge: 2018-07-01 | Disposition: A | Discharge status: Home / Self Care | Payer: Medicare Other | Source: Ambulatory Visit | Attending: Surgery | Admitting: Surgery

## 2018-07-01 ENCOUNTER — Other Ambulatory Visit: Payer: Self-pay

## 2018-07-01 DIAGNOSIS — Z79899 Other long term (current) drug therapy: Secondary | ICD-10-CM | POA: Diagnosis not present

## 2018-07-01 DIAGNOSIS — E039 Hypothyroidism, unspecified: Secondary | ICD-10-CM | POA: Insufficient documentation

## 2018-07-01 DIAGNOSIS — Z01812 Encounter for preprocedural laboratory examination: Secondary | ICD-10-CM | POA: Insufficient documentation

## 2018-07-01 DIAGNOSIS — F419 Anxiety disorder, unspecified: Secondary | ICD-10-CM | POA: Diagnosis not present

## 2018-07-01 DIAGNOSIS — Z79890 Hormone replacement therapy (postmenopausal): Secondary | ICD-10-CM | POA: Diagnosis not present

## 2018-07-01 DIAGNOSIS — K432 Incisional hernia without obstruction or gangrene: Secondary | ICD-10-CM | POA: Insufficient documentation

## 2018-07-01 HISTORY — DX: Other complications of anesthesia, initial encounter: T88.59XA

## 2018-07-01 HISTORY — DX: Adverse effect of unspecified anesthetic, initial encounter: T41.45XA

## 2018-07-01 HISTORY — DX: Other specified postprocedural states: Z98.890

## 2018-07-01 HISTORY — DX: Personal history of urinary calculi: Z87.442

## 2018-07-01 HISTORY — DX: Incisional hernia without obstruction or gangrene: K43.2

## 2018-07-01 HISTORY — DX: Other specified postprocedural states: R11.2

## 2018-07-01 LAB — CBC
HCT: 33.5 % — ABNORMAL LOW (ref 36.0–46.0)
HEMOGLOBIN: 10.5 g/dL — AB (ref 12.0–15.0)
MCH: 31.8 pg (ref 26.0–34.0)
MCHC: 31.3 g/dL (ref 30.0–36.0)
MCV: 101.5 fL — ABNORMAL HIGH (ref 78.0–100.0)
PLATELETS: 171 10*3/uL (ref 150–400)
RBC: 3.3 MIL/uL — ABNORMAL LOW (ref 3.87–5.11)
RDW: 13.2 % (ref 11.5–15.5)
WBC: 4.8 10*3/uL (ref 4.0–10.5)

## 2018-07-01 LAB — BASIC METABOLIC PANEL
ANION GAP: 9 (ref 5–15)
BUN: 8 mg/dL (ref 8–23)
CALCIUM: 8.7 mg/dL — AB (ref 8.9–10.3)
CO2: 22 mmol/L (ref 22–32)
CREATININE: 0.56 mg/dL (ref 0.44–1.00)
Chloride: 103 mmol/L (ref 98–111)
GLUCOSE: 99 mg/dL (ref 70–99)
Potassium: 3.9 mmol/L (ref 3.5–5.1)
Sodium: 134 mmol/L — ABNORMAL LOW (ref 135–145)

## 2018-07-01 NOTE — Progress Notes (Signed)
PCP - Dr. Reymundo Poll- [facility]  Cardiologist - [facility]  Chest x-ray - 02/06/18 (E)  EKG - 11/20/17 (E)  Stress Test - Denies  ECHO - 10/11/18 (E)  Cardiac Cath - 35 yrs- neg  Sleep Study - Denies CPAP - None  LABS- 07/01/18: CBC, BMP  ASA- Denies   Anesthesia- Yes- history  Pt denies having chest pain, sob, or fever at this time. All instructions explained to the pt, with a verbal understanding of the material. Pt agrees to go over the instructions while at home for a better understanding. The opportunity to ask questions was provided.

## 2018-07-02 NOTE — Progress Notes (Signed)
Anesthesia Chart Review:  Case:  [id number] Date/Time:  07/14/18 0715   Procedures:      OPEN REPAIR OF RECURRENT VENTRAL HERNIA ERAS PATHWAY (N/A )     INSERTION OF MESH (N/A )   Anesthesia type:  General   Pre-op diagnosis:  Recurrent ventral incisional hernia   Location:  [facility] OR ROOM 01 / [place] [facility]   Surgeon:  Donnie Mesa, MD      DISCUSSION: 84 yo female never smoker for above procedure. Pertinent hx includes PONV, Hypothyroid, Paroxysmal SVT s/p RFA >42yr ago, Anxiety, Wheelchair bound, s/p cannulated hip pinning 10/09/2017.  Pt saw her cardiologist Dr. Idolina Primer 06/09/2018 for preop clearance (notes in care everywhere). Per his note, "Doing well from CV standpoint.  She is intermediate risk for intermediate risk surgery; no further CV testing warranted from my standpoint prior to hernia surgery.  Will plan to repeat TTE next year."  In Dr. Ainsley Spinner clearance note he referenced an Echo from 04/07/2017 which showed EF 60-65. However, the pt did have another more recent Echo on 10/11/2017 showing her EF reduced to 45-50% with hypokinesis of the basal-midinferolateral myocardium. I called his office and faxed the results of the more recent Echo for his review. I received a call back on 07/06/2018 from his nurse Whitney and she stated that Dr. Carlis Abbott reviewed the more recent Echo and based on pt being asymptomatic he felt it was still okay to proceed with surgery.  Anticipate she can proceed as planned barring acute status change.  VS: BP (!) 107/54   Pulse 80   Temp 36.8 C   Resp 20   Ht 4\' 10"  (1.473 m)   Wt 54 kg   SpO2 95%   BMI 24.87 kg/m   PROVIDERS: Inocencio Homes, MD is PCP last seen 11/24/2017  Idolina Primer, MD is Cardiologist last seen 06/09/2018  LABS: Labs reviewed: Acceptable for surgery. (all labs ordered are listed, but only abnormal results are displayed)  Labs Reviewed  BASIC METABOLIC PANEL - Abnormal; Notable for the following components:      Result Value   Sodium 134 (*)    Calcium 8.7 (*)    All other components within normal limits  CBC - Abnormal; Notable for the following components:   RBC 3.30 (*)    Hemoglobin 10.5 (*)    HCT 33.5 (*)    MCV 101.5 (*)    All other components within normal limits     IMAGES: PORTABLE CHEST 1 VIEW 11/20/2017  COMPARISON:  CT 10/09/2017.  Chest x-ray 10/08/2017 .  FINDINGS: Left apex incompletely imaged. Mediastinum calcified right hilar lymph nodes again noted. Low lung volumes with mild bibasilar atelectasis and or scarring. COPD. Heart size normal. Degenerative changes thoracic spine with thoracic spine scoliosis. Multiple left-sided rib fractures, these may be old.  IMPRESSION: 1. Low lung volumes with mild bibasilar subsegmental atelectasis and/or scarring .  2. Multiple left-sided rib fractures, these may be old. No pneumothorax.   EKG: 11/21/2017: Sinus tach (111), borderline anterior t wave abnormality    CV: Echo 10/11/2017 (at the time she had sinus tach, now rate controlled on metoprolol and amiodarone) - Left ventricle: The cavity size was normal. Wall thickness was   normal. Systolic function was mildly reduced. The estimated   ejection fraction was in the range of 45% to 50%. There is   hypokinesis of the basal-midinferolateral myocardium. Doppler   parameters are consistent with abnormal left ventricular   relaxation (grade 1 diastolic  dysfunction). - Aortic valve: Valve mobility was restricted. There was mild   stenosis. There was mild regurgitation. Valve area (VTI): 1.5   cm^2. Valve area (Vmax): 1.29 cm^2. Valve area (Vmean): 1.22   cm^2. - Mitral valve: Calcified annulus. - Left atrium: The atrium was mildly dilated. - Right ventricle: The cavity size was moderately dilated. - Tricuspid valve: There was moderate regurgitation. - Pulmonary arteries: PA peak pressure: 55 mm Hg (S).  Per Care Everywhere pt had an Echo 04/07/2017. No results available but in Dr.  Ainsley Spinner preop clearance note he states "TTE (03/2017): ef 60-65, mild LAE, mild MR, mod TR, mild AI, severe ao sclerosis with mild gradient" Results requested.   Echo 12/13/2014 (Care Everywhere): FINDINGS:  LEFT VENTRICLE The left ventricular size is normal. There is normal left ventricular wall  thickness. Left ventricular systolic function is normal. LV ejection fraction  = 60-65%. Left ventricular filling pattern is impaired. The left ventricular  wall motion is normal. -  RIGHT VENTRICLE The right ventricle is normal in size and function.  LEFT ATRIUM The left atrium is mildly dilated.  RIGHT ATRIUM  Right atrial size is normal. - AORTIC VALVE There is aortic valve sclerosis. The aortic valve is trileaflet. The aortic  valve opens well. Mild aortic regurgitation. - MITRAL VALVE The mitral valve leaflets appear normal. There is mild mitral annular  calcification. There is mild to moderate mitral regurgitation. - TRICUSPID VALVE Structurally normal tricuspid valve. There is mild tricuspid regurgitation.  Mild pulmonary hypertension. - PULMONIC VALVE Structurally normal pulmonic valve. Trace to mild pulmonic valvular  regurgitation. - ARTERIES The aortic root is normal size. - VENOUS Pulmonary venous flow pattern is blunted. IVC size was mildly dilated. - EFFUSION There is no pericardial effusion.  Past Medical History:  Diagnosis Date  . Anxiety   . colon ca dx'd 1999  . Complication of anesthesia   . History of kidney stones   . Hypothyroidism   . PONV (postoperative nausea and vomiting)   . Recurrent ventral incisional hernia   . SVT (supraventricular tachycardia) (HCC)     Past Surgical History:  Procedure Laterality Date  . CARDIAC CATHETERIZATION    . CATARACT EXTRACTION, BILATERAL    . EYE SURGERY    . EYE SURGERY     Bilateral eye lids raised  . FRACTURE SURGERY    . HIP PINNING,CANNULATED Left 10/08/2017   Procedure: CANNULATED HIP  PINNING;  Surgeon: Gaynelle Arabian, MD;  Location: [facility] ORS;  Service: Orthopedics;  Laterality: Left;  . THYROIDECTOMY, PARTIAL      MEDICATIONS: . HYDROcodone-acetaminophen (NORCO/VICODIN) 5-325 MG tablet  . acetaminophen (TYLENOL) 500 MG tablet  . amiodarone (PACERONE) 100 MG tablet  . ASPERCREME LIDOCAINE 4 % LIQD  . celecoxib (CELEBREX) 100 MG capsule  . diclofenac sodium (VOLTAREN) 1 % GEL  . docusate sodium (COLACE) 250 MG capsule  . fluticasone (FLONASE) 50 MCG/ACT nasal spray  . furosemide (LASIX) 20 MG tablet  . Glycerin, Laxative, (GLYCERIN ADULT) 2 g SUPP  . guaifenesin (GERI-TUSSIN) 100 MG/5ML syrup  . levothyroxine (SYNTHROID, LEVOTHROID) 100 MCG tablet  . metoprolol tartrate (LOPRESSOR) 25 MG tablet  . mirtazapine (REMERON) 15 MG tablet  . nystatin cream (MYCOSTATIN)  . omeprazole (PRILOSEC) 20 MG capsule  . polyvinyl alcohol (LIQUIFILM TEARS) 1.4 % ophthalmic solution  . PSYLLIUM PO  . senna (SENOKOT) 8.6 MG tablet  . sertraline (ZOLOFT) 50 MG tablet  . sodium chloride (OCEAN) 0.65 % SOLN nasal spray  . sodium  chloride 1 g tablet   No current facility-administered medications for this encounter.      Wynonia Musty [facility] Short Stay Center/Anesthesiology Phone [phone] 07/06/2018 9:58 AM

## 2018-07-13 NOTE — Anesthesia Preprocedure Evaluation (Addendum)
Anesthesia Evaluation  Patient identified by MRN, date of birth, ID band Patient awake    Reviewed: Allergy & Precautions, NPO status , Patient's Chart, lab work & pertinent test results  History of Anesthesia Complications (+) PONV  Airway Mallampati: II  TM Distance: >3 FB Neck ROM: Full    Dental no notable dental hx. (+) Upper Dentures, Lower Dentures, Dental Advisory Given   Pulmonary neg pulmonary ROS,    Pulmonary exam normal breath sounds clear to auscultation       Cardiovascular Exercise Tolerance: Good +CHF  Normal cardiovascular exam Rhythm:Regular Rate:Normal  10/11/2017 Echo Left ventricle: The cavity size was normal. Wall thickness was   normal. Systolic function was mildly reduced. The estimated   ejection fraction was in the range of 45% to 50%. There is   hypokinesis of the basal-midinferolateral myocardium. Doppler   parameters are consistent with abnormal left ventricular   relaxation (grade 1 diastolic dysfunction).   Neuro/Psych Anxiety negative neurological ROS     GI/Hepatic Neg liver ROS, GERD  ,  Endo/Other  Hypothyroidism   Renal/GU negative Renal ROS     Musculoskeletal   Abdominal   Peds  Hematology  (+) anemia ,   Anesthesia Other Findings   Reproductive/Obstetrics                            Lab Results  Component Value Date   WBC 4.8 07/01/2018   HGB 10.5 (L) 07/01/2018   HCT 33.5 (L) 07/01/2018   MCV 101.5 (H) 07/01/2018   PLT 171 07/01/2018    Anesthesia Physical Anesthesia Plan  ASA: III  Anesthesia Plan: General   Post-op Pain Management:    Induction: Intravenous  PONV Risk Score and Plan: 4 or greater and Treatment may vary due to age or medical condition, Ondansetron and Dexamethasone  Airway Management Planned: Oral ETT  Additional Equipment:   Intra-op Plan:   Post-operative Plan: Extubation in OR  Informed Consent: I have  reviewed the patients History and Physical, chart, labs and discussed the procedure including the risks, benefits and alternatives for the proposed anesthesia with the patient or authorized representative who has indicated his/her understanding and acceptance.   Dental advisory given  Plan Discussed with: CRNA  Anesthesia Plan Comments:        Anesthesia Quick Evaluation

## 2018-07-14 ENCOUNTER — Inpatient Hospital Stay (HOSPITAL_COMMUNITY): Payer: Medicare Other | Admitting: Physician Assistant

## 2018-07-14 ENCOUNTER — Other Ambulatory Visit: Payer: Self-pay

## 2018-07-14 ENCOUNTER — Encounter (HOSPITAL_COMMUNITY)
Admission: RE | Disposition: A | Discharge status: Nursing Home (Includes ICF) | Payer: Self-pay | Source: Ambulatory Visit | Attending: Surgery

## 2018-07-14 ENCOUNTER — Encounter (HOSPITAL_COMMUNITY): Payer: Self-pay | Admitting: Surgery

## 2018-07-14 ENCOUNTER — Inpatient Hospital Stay (HOSPITAL_COMMUNITY)
Admission: RE | Admit: 2018-07-14 | Discharge: 2018-07-16 | DRG: 354 | Disposition: A | Discharge status: Nursing Home (Includes ICF) | Payer: Medicare Other | Source: Ambulatory Visit | Attending: Surgery | Admitting: Surgery

## 2018-07-14 ENCOUNTER — Inpatient Hospital Stay (HOSPITAL_COMMUNITY): Payer: Medicare Other | Admitting: Anesthesiology

## 2018-07-14 DIAGNOSIS — I5030 Unspecified diastolic (congestive) heart failure: Secondary | ICD-10-CM | POA: Diagnosis present

## 2018-07-14 DIAGNOSIS — F419 Anxiety disorder, unspecified: Secondary | ICD-10-CM | POA: Diagnosis present

## 2018-07-14 DIAGNOSIS — Z85038 Personal history of other malignant neoplasm of large intestine: Secondary | ICD-10-CM | POA: Diagnosis not present

## 2018-07-14 DIAGNOSIS — Z79899 Other long term (current) drug therapy: Secondary | ICD-10-CM

## 2018-07-14 DIAGNOSIS — K219 Gastro-esophageal reflux disease without esophagitis: Secondary | ICD-10-CM | POA: Diagnosis present

## 2018-07-14 DIAGNOSIS — Z993 Dependence on wheelchair: Secondary | ICD-10-CM

## 2018-07-14 DIAGNOSIS — Z79890 Hormone replacement therapy (postmenopausal): Secondary | ICD-10-CM

## 2018-07-14 DIAGNOSIS — E039 Hypothyroidism, unspecified: Secondary | ICD-10-CM | POA: Diagnosis present

## 2018-07-14 DIAGNOSIS — K432 Incisional hernia without obstruction or gangrene: Secondary | ICD-10-CM | POA: Diagnosis present

## 2018-07-14 HISTORY — PX: ABDOMINAL HERNIA REPAIR: SHX539

## 2018-07-14 HISTORY — PX: VENTRAL HERNIA REPAIR: SHX424

## 2018-07-14 LAB — CREATININE, SERUM
CREATININE: 0.47 mg/dL (ref 0.44–1.00)
GFR calc Af Amer: >60 mL/min (ref >60)

## 2018-07-14 LAB — CBC
HCT: 36.5 % (ref 36.0–46.0)
HEMOGLOBIN: 11.9 g/dL — AB (ref 12.0–15.0)
MCH: 31.4 pg (ref 26.0–34.0)
MCHC: 32.6 g/dL (ref 30.0–36.0)
MCV: 96.3 fL (ref 78.0–100.0)
PLATELETS: 270 10*3/uL (ref 150–400)
RBC: 3.79 MIL/uL — ABNORMAL LOW (ref 3.87–5.11)
RDW: 13.6 % (ref 11.5–15.5)
WBC: 5 10*3/uL (ref 4.0–10.5)

## 2018-07-14 SURGERY — REPAIR, HERNIA, VENTRAL
Anesthesia: General | Site: Abdomen

## 2018-07-14 MED ORDER — ACETAMINOPHEN 500 MG PO TABS
ORAL_TABLET | ORAL | Status: AC
  Administered 2018-07-14: 1000 mg
  Filled 2018-07-14: qty 2

## 2018-07-14 MED ORDER — MIRTAZAPINE 15 MG PO TABS
15.0000 mg | ORAL_TABLET | Freq: Every day | ORAL | Status: DC
  Administered 2018-07-14 – 2018-07-15 (×2): 15 mg via ORAL
  Filled 2018-07-14 (×2): qty 1

## 2018-07-14 MED ORDER — DEXAMETHASONE SODIUM PHOSPHATE 10 MG/ML IJ SOLN
INTRAMUSCULAR | Status: AC
  Filled 2018-07-14: qty 1

## 2018-07-14 MED ORDER — LACTATED RINGERS IV SOLN
INTRAVENOUS | Status: DC | PRN
  Administered 2018-07-14: 07:00:00 via INTRAVENOUS

## 2018-07-14 MED ORDER — ROCURONIUM BROMIDE 50 MG/5ML IV SOSY
PREFILLED_SYRINGE | INTRAVENOUS | Status: DC | PRN
  Administered 2018-07-14: 10 mg via INTRAVENOUS
  Administered 2018-07-14: 30 mg via INTRAVENOUS

## 2018-07-14 MED ORDER — FUROSEMIDE 20 MG PO TABS
20.0000 mg | ORAL_TABLET | Freq: Every day | ORAL | Status: DC
  Administered 2018-07-14 – 2018-07-16 (×3): 20 mg via ORAL
  Filled 2018-07-14 (×3): qty 1

## 2018-07-14 MED ORDER — 0.9 % SODIUM CHLORIDE (POUR BTL) OPTIME
TOPICAL | Status: DC | PRN
  Administered 2018-07-14: 1000 mL

## 2018-07-14 MED ORDER — DIPHENHYDRAMINE HCL 50 MG/ML IJ SOLN: 12.5000 mg | Freq: Four times a day (QID) | INTRAMUSCULAR | Status: DC | PRN

## 2018-07-14 MED ORDER — PANTOPRAZOLE SODIUM 40 MG PO TBEC
40.0000 mg | DELAYED_RELEASE_TABLET | Freq: Every day | ORAL | Status: DC
  Administered 2018-07-15 – 2018-07-16 (×2): 40 mg via ORAL
  Filled 2018-07-14 (×3): qty 1

## 2018-07-14 MED ORDER — SODIUM CHLORIDE 0.9 % IV SOLN
INTRAVENOUS | Status: DC
  Administered 2018-07-14 – 2018-07-16 (×3): via INTRAVENOUS

## 2018-07-14 MED ORDER — PROPOFOL 10 MG/ML IV BOLUS
INTRAVENOUS | Status: DC | PRN
  Administered 2018-07-14: 80 mg via INTRAVENOUS

## 2018-07-14 MED ORDER — LIDOCAINE 2% (20 MG/ML) 5 ML SYRINGE
INTRAMUSCULAR | Status: DC | PRN
  Administered 2018-07-14: 100 mg via INTRAVENOUS

## 2018-07-14 MED ORDER — SODIUM CHLORIDE 0.9 % IV SOLN
INTRAVENOUS | Status: DC | PRN
  Administered 2018-07-14: 25 ug/min via INTRAVENOUS

## 2018-07-14 MED ORDER — ROCURONIUM BROMIDE 50 MG/5ML IV SOSY
PREFILLED_SYRINGE | INTRAVENOUS | Status: AC
  Filled 2018-07-14: qty 5

## 2018-07-14 MED ORDER — VANCOMYCIN HCL IN DEXTROSE 1-5 GM/200ML-% IV SOLN
INTRAVENOUS | Status: AC
  Filled 2018-07-14: qty 200

## 2018-07-14 MED ORDER — VANCOMYCIN HCL IN DEXTROSE 1-5 GM/200ML-% IV SOLN
1000.0000 mg | Freq: Once | INTRAVENOUS | Status: AC
  Administered 2018-07-14: 1000 mg via INTRAVENOUS

## 2018-07-14 MED ORDER — ONDANSETRON HCL 4 MG/2ML IJ SOLN: 4.0000 mg | Freq: Four times a day (QID) | INTRAMUSCULAR | Status: DC | PRN

## 2018-07-14 MED ORDER — KETOROLAC TROMETHAMINE 15 MG/ML IJ SOLN
INTRAMUSCULAR | Status: AC
  Filled 2018-07-14: qty 1

## 2018-07-14 MED ORDER — DEXMEDETOMIDINE HCL 200 MCG/2ML IV SOLN
INTRAVENOUS | Status: DC | PRN
  Administered 2018-07-14: 8 ug via INTRAVENOUS

## 2018-07-14 MED ORDER — DICLOFENAC SODIUM 1 % TD GEL
2.0000 g | TRANSDERMAL | Status: DC
  Administered 2018-07-15: 2 g via TOPICAL
  Filled 2018-07-14: qty 100

## 2018-07-14 MED ORDER — AMIODARONE HCL 100 MG PO TABS
100.0000 mg | ORAL_TABLET | Freq: Every day | ORAL | Status: DC
  Administered 2018-07-15 – 2018-07-16 (×2): 100 mg via ORAL
  Filled 2018-07-14 (×2): qty 1

## 2018-07-14 MED ORDER — ONDANSETRON 4 MG PO TBDP: 4.0000 mg | ORAL_TABLET | Freq: Four times a day (QID) | ORAL | Status: DC | PRN

## 2018-07-14 MED ORDER — SUGAMMADEX SODIUM 200 MG/2ML IV SOLN
INTRAVENOUS | Status: DC | PRN
  Administered 2018-07-14: 200 mg via INTRAVENOUS

## 2018-07-14 MED ORDER — CHLORHEXIDINE GLUCONATE CLOTH 2 % EX PADS: 6.0000 | MEDICATED_PAD | Freq: Once | CUTANEOUS | Status: DC

## 2018-07-14 MED ORDER — KETOROLAC TROMETHAMINE 15 MG/ML IJ SOLN
15.0000 mg | Freq: Once | INTRAMUSCULAR | Status: AC
  Administered 2018-07-14: 15 mg via INTRAVENOUS

## 2018-07-14 MED ORDER — ONDANSETRON HCL 4 MG/2ML IJ SOLN
INTRAMUSCULAR | Status: AC
  Filled 2018-07-14: qty 2

## 2018-07-14 MED ORDER — ENOXAPARIN SODIUM 30 MG/0.3ML ~~LOC~~ SOLN
30.0000 mg | SUBCUTANEOUS | Status: DC
  Administered 2018-07-15 – 2018-07-16 (×2): 30 mg via SUBCUTANEOUS
  Filled 2018-07-14 (×2): qty 0.3

## 2018-07-14 MED ORDER — FLUTICASONE PROPIONATE 50 MCG/ACT NA SUSP
1.0000 | Freq: Every day | NASAL | Status: DC
  Administered 2018-07-15 – 2018-07-16 (×2): 1 via NASAL
  Filled 2018-07-14: qty 16

## 2018-07-14 MED ORDER — ONDANSETRON HCL 4 MG/2ML IJ SOLN
INTRAMUSCULAR | Status: DC | PRN
  Administered 2018-07-14: 4 mg via INTRAVENOUS

## 2018-07-14 MED ORDER — FENTANYL CITRATE (PF) 250 MCG/5ML IJ SOLN
INTRAMUSCULAR | Status: AC
  Filled 2018-07-14: qty 5

## 2018-07-14 MED ORDER — LEVOTHYROXINE SODIUM 100 MCG PO TABS
100.0000 ug | ORAL_TABLET | Freq: Every day | ORAL | Status: DC
  Administered 2018-07-15 – 2018-07-16 (×2): 100 ug via ORAL
  Filled 2018-07-14 (×2): qty 1

## 2018-07-14 MED ORDER — SERTRALINE HCL 50 MG PO TABS
50.0000 mg | ORAL_TABLET | Freq: Every day | ORAL | Status: DC
  Administered 2018-07-14 – 2018-07-16 (×3): 50 mg via ORAL
  Filled 2018-07-14 (×3): qty 1

## 2018-07-14 MED ORDER — TRAMADOL HCL 50 MG PO TABS
50.0000 mg | ORAL_TABLET | Freq: Four times a day (QID) | ORAL | Status: DC | PRN
  Administered 2018-07-14: 50 mg via ORAL
  Filled 2018-07-14: qty 1

## 2018-07-14 MED ORDER — FENTANYL CITRATE (PF) 100 MCG/2ML IJ SOLN: 25.0000 ug | INTRAMUSCULAR | Status: DC | PRN

## 2018-07-14 MED ORDER — DIPHENHYDRAMINE HCL 12.5 MG/5ML PO ELIX: 12.5000 mg | ORAL_SOLUTION | Freq: Four times a day (QID) | ORAL | Status: DC | PRN

## 2018-07-14 MED ORDER — BUPIVACAINE LIPOSOME 1.3 % IJ SUSP
20.0000 mL | INTRAMUSCULAR | Status: AC
  Administered 2018-07-14: 20 mL
  Filled 2018-07-14: qty 20

## 2018-07-14 MED ORDER — CEFAZOLIN SODIUM-DEXTROSE 2-4 GM/100ML-% IV SOLN: 2.0000 g | INTRAVENOUS | Status: DC

## 2018-07-14 MED ORDER — DEXAMETHASONE SODIUM PHOSPHATE 10 MG/ML IJ SOLN
INTRAMUSCULAR | Status: DC | PRN
  Administered 2018-07-14: 5 mg via INTRAVENOUS

## 2018-07-14 MED ORDER — DOCUSATE SODIUM 50 MG PO CAPS
250.0000 mg | ORAL_CAPSULE | Freq: Every day | ORAL | Status: DC
  Administered 2018-07-14 – 2018-07-15 (×2): 250 mg via ORAL
  Filled 2018-07-14 (×3): qty 1

## 2018-07-14 MED ORDER — CELECOXIB 100 MG PO CAPS
100.0000 mg | ORAL_CAPSULE | Freq: Two times a day (BID) | ORAL | Status: DC
  Administered 2018-07-14 – 2018-07-16 (×5): 100 mg via ORAL
  Filled 2018-07-14 (×6): qty 1

## 2018-07-14 MED ORDER — FENTANYL CITRATE (PF) 100 MCG/2ML IJ SOLN
INTRAMUSCULAR | Status: DC | PRN
  Administered 2018-07-14: 25 ug via INTRAVENOUS
  Administered 2018-07-14: 50 ug via INTRAVENOUS
  Administered 2018-07-14: 25 ug via INTRAVENOUS

## 2018-07-14 MED ORDER — ACETAMINOPHEN 10 MG/ML IV SOLN: 1000.0000 mg | Freq: Once | INTRAVENOUS | Status: DC | PRN

## 2018-07-14 MED ORDER — LIDOCAINE 2% (20 MG/ML) 5 ML SYRINGE
INTRAMUSCULAR | Status: AC
  Filled 2018-07-14: qty 5

## 2018-07-14 MED ORDER — GABAPENTIN 300 MG PO CAPS
300.0000 mg | ORAL_CAPSULE | Freq: Two times a day (BID) | ORAL | Status: DC
  Administered 2018-07-14 – 2018-07-15 (×4): 300 mg via ORAL
  Filled 2018-07-14 (×4): qty 1

## 2018-07-14 MED ORDER — PROPOFOL 10 MG/ML IV BOLUS
INTRAVENOUS | Status: AC
  Filled 2018-07-14: qty 20

## 2018-07-14 MED ORDER — ACETAMINOPHEN 500 MG PO TABS
1000.0000 mg | ORAL_TABLET | Freq: Four times a day (QID) | ORAL | Status: DC
  Administered 2018-07-14 – 2018-07-16 (×8): 1000 mg via ORAL
  Filled 2018-07-14 (×9): qty 2

## 2018-07-14 SURGICAL SUPPLY — 35 items
BENZOIN TINCTURE PRP APPL 2/3 (GAUZE/BANDAGES/DRESSINGS) ×4 IMPLANT
BINDER ABD UNIV 12 45-62 (WOUND CARE) ×2 IMPLANT
BINDER ABDOMINAL 46IN 62IN (WOUND CARE) ×4
BLADE CLIPPER SURG (BLADE) IMPLANT
CANISTER SUCT 3000ML PPV (MISCELLANEOUS) ×4 IMPLANT
CHLORAPREP W/TINT 26ML (MISCELLANEOUS) ×4 IMPLANT
CLOSURE WOUND 1/2 X4 (GAUZE/BANDAGES/DRESSINGS) ×1
COVER SURGICAL LIGHT HANDLE (MISCELLANEOUS) ×4 IMPLANT
DRAPE LAPAROSCOPIC ABDOMINAL (DRAPES) ×4 IMPLANT
DRSG TEGADERM 4X4.75 (GAUZE/BANDAGES/DRESSINGS) ×4 IMPLANT
ELECT CAUTERY BLADE 6.4 (BLADE) ×4 IMPLANT
ELECT REM PT RETURN 9FT ADLT (ELECTROSURGICAL) ×4
ELECTRODE REM PT RTRN 9FT ADLT (ELECTROSURGICAL) ×2 IMPLANT
GAUZE SPONGE 4X4 12PLY STRL (GAUZE/BANDAGES/DRESSINGS) ×4 IMPLANT
GLOVE BIO SURGEON STRL SZ7 (GLOVE) ×4 IMPLANT
GLOVE BIOGEL PI IND STRL 7.5 (GLOVE) ×2 IMPLANT
GLOVE BIOGEL PI INDICATOR 7.5 (GLOVE) ×2
GOWN STRL REUS W/ TWL LRG LVL3 (GOWN DISPOSABLE) ×4 IMPLANT
GOWN STRL REUS W/TWL LRG LVL3 (GOWN DISPOSABLE) ×4
KIT BASIN OR (CUSTOM PROCEDURE TRAY) ×4 IMPLANT
KIT TURNOVER KIT B (KITS) ×4 IMPLANT
NEEDLE HYPO 25GX1X1/2 BEV (NEEDLE) ×4 IMPLANT
NS IRRIG 1000ML POUR BTL (IV SOLUTION) ×4 IMPLANT
PACK GENERAL/GYN (CUSTOM PROCEDURE TRAY) ×4 IMPLANT
PAD ARMBOARD 7.5X6 YLW CONV (MISCELLANEOUS) ×8 IMPLANT
STRIP CLOSURE SKIN 1/2X4 (GAUZE/BANDAGES/DRESSINGS) ×3 IMPLANT
SUT MNCRL AB 4-0 PS2 18 (SUTURE) ×4 IMPLANT
SUT NOVA NAB GS-21 0 18 T12 DT (SUTURE) ×8 IMPLANT
SUT NOVA NAB GS-21 1 T12 (SUTURE) IMPLANT
SUT VIC AB 3-0 SH 27 (SUTURE) ×2
SUT VIC AB 3-0 SH 27XBRD (SUTURE) ×2 IMPLANT
SYR CONTROL 10ML LL (SYRINGE) ×4 IMPLANT
TOWEL OR 17X24 6PK STRL BLUE (TOWEL DISPOSABLE) ×4 IMPLANT
TOWEL OR 17X26 10 PK STRL BLUE (TOWEL DISPOSABLE) ×4 IMPLANT
TRAY FOLEY MTR SLVR 14FR STAT (SET/KITS/TRAYS/PACK) IMPLANT

## 2018-07-14 NOTE — Anesthesia Procedure Notes (Signed)
Procedure Name: Intubation Date/Time: 07/14/2018 7:37 AM Performed by: Kyung Rudd, CRNA Pre-anesthesia Checklist: Patient identified, Emergency Drugs available, Suction available, Patient being monitored and Timeout performed Patient Re-evaluated:Patient Re-evaluated prior to induction Oxygen Delivery Method: Circle system utilized Preoxygenation: Pre-oxygenation with 100% oxygen Induction Type: IV induction Ventilation: Mask ventilation without difficulty Laryngoscope Size: Mac and 3 Grade View: Grade I Tube type: Oral Tube size: 7.0 mm Number of attempts: 1 Airway Equipment and Method: Stylet Placement Confirmation: ETT inserted through vocal cords under direct vision,  positive ETCO2 and breath sounds checked- equal and bilateral Secured at: 21 cm Tube secured with: Tape Dental Injury: Teeth and Oropharynx as per pre-operative assessment

## 2018-07-14 NOTE — Interval H&P Note (Signed)
History and Physical Interval Note:  07/14/2018 6:58 AM  Raven Snyder  has presented today for surgery, with the diagnosis of Recurrent ventral incisional hernia  The various methods of treatment have been discussed with the patient and family. After consideration of risks, benefits and other options for treatment, the patient has consented to  Procedure(s): [place] (N/A) INSERTION OF MESH (N/A) as a surgical intervention .  The patient's history has been reviewed, patient examined, no change in status, stable for surgery.  I have reviewed the patient's chart and labs.  Questions were answered to the patient's satisfaction.     Maia Petties

## 2018-07-14 NOTE — Transfer of Care (Signed)
Immediate Anesthesia Transfer of Care Note  Patient: Raven Snyder  Procedure(s) Performed: OPEN REPAIR OF RECURRENT VENTRAL HERNIA (N/A Abdomen)  Patient Location: PACU  Anesthesia Type:General  Level of Consciousness: awake, alert  and oriented  Airway & Oxygen Therapy: Patient Spontanous Breathing and Patient connected to nasal cannula oxygen  Post-op Assessment: Report given to RN, Post -op Vital signs reviewed and stable and Patient moving all extremities  Post vital signs: Reviewed and stable  Last Vitals:  Vitals Value Taken Time  BP 173/77 07/14/2018  8:44 AM  Temp    Pulse 76 07/14/2018  8:48 AM  Resp 63 07/14/2018  8:48 AM  SpO2 97 % 07/14/2018  8:48 AM  Vitals shown include unvalidated device data.  Last Pain:  Vitals:   07/14/18 0603  TempSrc:   PainSc: 6       Patients Stated Pain Goal: 3 (82/88/33 7445)  Complications: No apparent anesthesia complications

## 2018-07-14 NOTE — Op Note (Signed)
Ventral Hernia Repair Procedure Note  Indications: Symptomatic recurrent ventral hernia  Pre-operative Diagnosis: Recurrent midline ventral incisional hernia  Post-operative Diagnosis: same  Surgeon: Maia Petties   Assistants: none   Anesthesia: General endotracheal anesthesia and Exparel ASA Class: 3  Procedure Details  The patient was seen in the Holding Room. The risks, benefits, complications, treatment options, and expected outcomes were discussed with the patient. The possibilities of reaction to medication, pulmonary aspiration, perforation of viscus, bleeding, recurrent infection, the need for additional procedures, failure to diagnose a condition, and creating a complication requiring transfusion or operation were discussed with the patient. The patient concurred with the proposed plan, giving informed consent.  The site of surgery properly noted/marked. The patient was taken to the operating room, identified as Prentiss Leever and the procedure verified as ventral hernia repair. A Time Out was held and the above information confirmed.  The patient was placed supine.  After establishing general anesthesia, the abdomen was prepped with Chloraprep and draped in sterile fashion.  We made a vertical incision over the palpable hernia around the umbilicus. Dissection was carried down to the hernia sac located above the fascia and mobilized from surrounding structures.  Intact fascia was identified circumferentially around the defect.  We opened the hernia sac and encountered some old Proceed mesh.  The mesh had obviously stretched up into the hernia defect.  The hernia was previously repaired laparoscopically so the defect probably was not closed anterior to the mesh.  The mesh had eventrated up into the hernia defect.    I made the decision not to excise the entire mesh, but to just primarily close the fascia. The patient has limited mobility and the goal of the surgery is to prevent the  colon from herniating up into the defect.  The fascial defect was reapproximated with interrupted figure-of-8 0 Novofil sutures.  I infiltrated 20 ml of Exparel into the fascia. The subcutaneous tissues were irrigated.  Hemostasis was confirmed.  The skin incision was closed in layers with a 3-0 Vicryl and 4-0 Monocryl subcuticular closure.  Steri-Strips were applied at the end of the operation.    Instrument, sponge, and needle counts were correct prior to closure and at the conclusion of the case.   Findings: 4 cm fascial defect with eventrated mesh  Estimated Blood Loss:  Minimal         Drains: none                      Complications:  None; patient tolerated the procedure well.         Disposition: PACU - hemodynamically stable.         Condition: stable  Imogene Burn. Georgette Dover, MD, [facility] Surgery  General/ Trauma Surgery Beeper [phone]  07/14/2018 8:43 AM

## 2018-07-14 NOTE — Anesthesia Postprocedure Evaluation (Signed)
Anesthesia Post Note  Patient: Raven Snyder  Procedure(s) Performed: OPEN REPAIR OF RECURRENT VENTRAL HERNIA (N/A Abdomen)     Patient location during evaluation: PACU Anesthesia Type: General Level of consciousness: awake and alert Pain management: pain level controlled Vital Signs Assessment: post-procedure vital signs reviewed and stable Respiratory status: spontaneous breathing, nonlabored ventilation, respiratory function stable and patient connected to nasal cannula oxygen Cardiovascular status: blood pressure returned to baseline and stable Postop Assessment: no apparent nausea or vomiting Anesthetic complications: no    Last Vitals:  Vitals:   07/14/18 1414 07/14/18 1830  BP: 132/76 (!) 109/54  Pulse: 95 84  Resp:  16  Temp: 36.7 C 37.1 C  SpO2: 96% 95%    Last Pain:  Vitals:   07/14/18 1844  TempSrc:   PainSc: 7                  Barnet Glasgow

## 2018-07-14 NOTE — Plan of Care (Signed)

## 2018-07-15 ENCOUNTER — Encounter (HOSPITAL_COMMUNITY): Payer: Self-pay | Admitting: Surgery

## 2018-07-15 NOTE — Social Work (Signed)
CSW acknowledging pt from [facility] will follow for post surgical disposition.   Alexander Mt, [place] Work [phone]

## 2018-07-15 NOTE — Progress Notes (Signed)
1 Day Post-Op   Subjective/Chief Complaint: Patient is very sleepy, but arousable No obvious complaints Not receiving any narcotics - using Tylenol, Neurontin, Celebrex   Objective: Vital signs in last 24 hours: Temp:  [97.6 F (36.4 C)-98.7 F (37.1 C)] 98.6 F (37 C) (08/28 0537) Pulse Rate:  [66-95] 74 (08/28 0537) Resp:  [15-29] 17 (08/28 0537) BP: (109-173)/(54-129) 146/73 (08/28 0537) SpO2:  [90 %-100 %] 90 % (08/28 0537)    Intake/Output from previous day: 08/27 0701 - 08/28 0700 In: 1442.6 [P.O.:240; I.V.:1202.6] Out: 920 [Urine:900; Blood:20] Intake/Output this shift: No intake/output data recorded.  General appearance: sleepy, arousable, no complaints GI: soft, minimal tenderness Incision c/d/i  Lab Results:  Recent Labs    07/14/18 1029  WBC 5.0  HGB 11.9*  HCT 36.5  PLT 270   BMET Recent Labs    07/14/18 1029  CREATININE 0.47   PT/INR No results for input(s): LABPROT, INR in the last 72 hours. ABG No results for input(s): PHART, HCO3 in the last 72 hours.  Invalid input(s): PCO2, PO2  Studies/Results: No results found.  Anti-infectives: Anti-infectives (From admission, onward)   Start     Dose/Rate Route Frequency Ordered Stop   07/14/18 0715  vancomycin (VANCOCIN) IVPB 1000 mg/200 mL premix     1,000 mg 200 mL/hr over 60 Minutes Intravenous  Once 07/14/18 0714 07/14/18 0840   07/14/18 0714  vancomycin (VANCOCIN) 1-5 GM/200ML-% IVPB    Note to Pharmacy:  Laurita Quint   : cabinet override      07/14/18 0714 07/14/18 0740   07/14/18 0600  ceFAZolin (ANCEF) IVPB 2g/100 mL premix  Status:  Discontinued     2 g 200 mL/hr over 30 Minutes Intravenous On call to O.R. 07/14/18 0543 07/14/18 0714      Assessment/Plan: S/p open repair of recurrent ventral incisional hernia 8/27   Doing well, but very sleepy Will d/c Neurontin and just use tylenol OOB to chair Hopefully back to facility tomorrow.  Imogene Burn. Georgette Dover, MD, [facility] Surgery  General/ Trauma Surgery Beeper [phone]  07/15/2018 7:45 AM   LOS: 1 day    Raven Snyder 07/15/2018

## 2018-07-15 NOTE — Evaluation (Signed)
Physical Therapy Evaluation Patient Details Name: Raven Snyder MRN: [id number] DOB: 10/01/1929 Today's Date: 07/15/2018   History of Present Illness  Pt is an 84 y/o female s/p open repair of recurrent ventral hernia on 8/27. PMH includes CHF, GERD, and anxiety.   Clinical Impression  Pt is s/p surgery above with deficits below. Pt requiring heavy max to total A +2 to transfer to [facility]. Pt also presenting with cognitive deficits; unsure of baseline as no family present. Pt reports husband assisted with transfers to [facility], however, pt reports that he stated he did not have to lift very much. Feel pt is requiring increased assist and husband will have difficulty safely assisting pt with transfers. Recommending short term SNF at d/c to increase independence and safety with functional mobility. Will continue to follow acutely.     Follow Up Recommendations SNF;Supervision/Assistance - 24 hour    Equipment Recommendations  None recommended by PT    Recommendations for Other Services       Precautions / Restrictions Precautions Precautions: Fall Restrictions Weight Bearing Restrictions: No      Mobility  Bed Mobility Overal bed mobility: Needs Assistance Bed Mobility: Supine to Sit;Sit to Supine     Supine to sit: Max assist Sit to supine: Total assist;+2 for physical assistance   General bed mobility comments: Heavy max A for LE assist and trunk elevation. Required total A +2 to return to supine.   Transfers Overall transfer level: Needs assistance Equipment used: 2 person hand held assist Transfers: Sit to/from [facility] Sit to Stand: Max assist;Total assist;+2 physical assistance Stand pivot transfers: Max assist;Total assist;+2 physical assistance       General transfer comment: Attempted standing X2 with +1 assist, however, pt unable to achieve full upright posture. Required max-total A +2 to perform stand pivot to [facility] and back to bed. Verbal cues for  sequencing and pt not responding to cues to stand upright.   Ambulation/Gait             General Gait Details: Unsafe to attempt.   Stairs            Wheelchair Mobility    Modified Rankin (Stroke Patients Only)       Balance Overall balance assessment: Needs assistance Sitting-balance support: Bilateral upper extremity supported;Feet supported Sitting balance-Leahy Scale: Poor Sitting balance - Comments: REliant on BUE support.    Standing balance support: Bilateral upper extremity supported;During functional activity Standing balance-Leahy Scale: Zero Standing balance comment: Max-total A +2 to maintain standing balance.                              Pertinent Vitals/Pain Pain Assessment: Faces Faces Pain Scale: Hurts little more Pain Location: abdomen  Pain Descriptors / Indicators: Aching;Operative site guarding Pain Intervention(s): Limited activity within patient's tolerance;Monitored during session;Repositioned    Home Living Family/patient expects to be discharged to:: Assisted living([facility] )               Home Equipment: Wheelchair - manual      Prior Function Level of Independence: Needs assistance   Gait / Transfers Assistance Needed: Reports that husband would assist her to [facility], however, reported husband stated he was not having to lift her up.   ADL's / Homemaking Assistance Needed: Needed assist for transfers into shower, however, reports she could bathe independently and dress independently.         Hand Dominance  Dominant Hand: Right    Extremity/Trunk Assessment   Upper Extremity Assessment Upper Extremity Assessment: Generalized weakness    Lower Extremity Assessment Lower Extremity Assessment: Generalized weakness;LLE deficits/detail LLE Deficits / Details: L leg length discrepancy at baseline secondary to fracture.     Cervical / Trunk Assessment Cervical / Trunk Assessment: Kyphotic   Communication   Communication: HOH  Cognition Arousal/Alertness: Awake/alert Behavior During Therapy: WFL for tasks assessed/performed Overall Cognitive Status: Impaired/Different from baseline Area of Impairment: Orientation;Memory;Problem solving;Awareness;Following commands                 Orientation Level: Disoriented to;Time;Situation   Memory: Decreased short-term memory Following Commands: Follows one step commands with increased time   Awareness: Emergent Problem Solving: Slow processing;Requires verbal cues General Comments: Stated she had surgery, but was not sure on what. Did not know what year it was. Noted pt with slow processing as well.       General Comments General comments (skin integrity, edema, etc.): No family present    Exercises     Assessment/Plan    PT Assessment Patient needs continued PT services  PT Problem List Decreased strength;Decreased balance;Decreased mobility;Decreased knowledge of use of DME;Decreased knowledge of precautions;Decreased cognition;Decreased safety awareness       PT Treatment Interventions DME instruction;Functional mobility training;Therapeutic activities;Therapeutic exercise;Balance training;Patient/family education;Wheelchair mobility training    PT Goals (Current goals can be found in the Care Plan section)  Acute Rehab PT Goals Patient Stated Goal: "to be able to transfer to my WC" PT Goal Formulation: With patient Time For Goal Achievement: 07/29/18 Potential to Achieve Goals: Fair    Frequency Min 2X/week   Barriers to discharge        Co-evaluation               AM-PAC PT "6 Clicks" Daily Activity  Outcome Measure Difficulty turning over in bed (including adjusting bedclothes, sheets and blankets)?: Unable Difficulty moving from lying on back to sitting on the side of the bed? : Unable Difficulty sitting down on and standing up from a chair with arms (e.g., wheelchair, bedside commode,  etc,.)?: Unable Help needed moving to and from a bed to chair (including a wheelchair)?: Total Help needed walking in hospital room?: Total Help needed climbing 3-5 steps with a railing? : Total 6 Click Score: 6    End of Session Equipment Utilized During Treatment: Gait belt Activity Tolerance: Patient tolerated treatment well Patient left: in bed;with call bell/phone within reach;with bed alarm set Nurse Communication: Mobility status PT Visit Diagnosis: Unsteadiness on feet (R26.81);Muscle weakness (generalized) (M62.81)    Time: 2831-5176 PT Time Calculation (min) (ACUTE ONLY): 23 min   Charges:   PT Evaluation $PT Eval Moderate Complexity: 1 Mod PT Treatments $Therapeutic Activity: 8-22 mins        Leighton Ruff, PT, DPT  Acute Rehabilitation Services  Pager: [phone]   Rudean Hitt 07/15/2018, 4:33 PM

## 2018-07-16 MED ORDER — SODIUM CHLORIDE 0.9% FLUSH: 3.0000 mL | INTRAVENOUS | Status: DC | PRN

## 2018-07-16 MED ORDER — SODIUM CHLORIDE 0.9 % IV SOLN: 250.0000 mL | INTRAVENOUS | Status: DC | PRN

## 2018-07-16 MED ORDER — SODIUM CHLORIDE 0.9% FLUSH
3.0000 mL | Freq: Two times a day (BID) | INTRAVENOUS | Status: DC
  Administered 2018-07-16: 3 mL via INTRAVENOUS

## 2018-07-16 NOTE — Clinical Social Work Placement (Addendum)
   CLINICAL SOCIAL WORK PLACEMENT  NOTE [facility] at [facility]  Date:  07/16/2018  Patient Details  Name: Raven Snyder MRN: [id number] Date of Birth: [birth year]  Clinical Social Work is seeking post-discharge placement for this patient at the [place] level of care (*CSW will initial, date and re-position this form in  chart as items are completed):  No   Patient/family provided with [facility] Work Department's list of facilities offering this level of care within the geographic area requested by the patient (or if unable, by the patient's family).  Yes   Patient/family informed of their freedom to choose among providers that offer the needed level of care, that participate in Medicare, Medicaid or managed care program needed by the patient, have an available bed and are willing to accept the patient.  No   Patient/family informed of [facility]'s ownership interest in [facility] and [facility], as well as of the fact that they are under no obligation to receive care at these facilities.  PASRR submitted to EDS on       PASRR number received on       Existing PASRR number confirmed on 07/15/18     FL2 transmitted to all facilities in geographic area requested by pt/family on 07/16/18     FL2 transmitted to all facilities within larger geographic area on       Patient informed that his/her managed care company has contracts with or will negotiate with certain facilities, including the following:        Yes   Patient/family informed of bed offers received.  Patient chooses bed at Other - please specify in the comment section below:([facility] at [facility])     Physician recommends and patient chooses bed at      Patient to be transferred to Other - please specify in the comment section below:([facility] at [facility]) on 07/16/18.  Patient to be transferred to facility by [facility]     Patient family  notified on 07/16/18 of transfer.  Name of family member notified:  daughter [name]     PHYSICIAN       Additional Comment:    _______________________________________________ Alexander Mt, [place] 07/16/2018, 11:51 AM

## 2018-07-16 NOTE — Social Work (Addendum)
Discharge summary and FL2 faxed to [facility].   Clinical Social Worker facilitated patient discharge including contacting patient family and facility to confirm patient discharge plans.  Clinical information faxed to facility and family agreeable with plan.  Pt will transport via ALF transport to [facility].  RN to call [phone] with report prior to discharge.  Clinical Social Worker will sign off for now as social work intervention is no longer needed. Please consult [place] again if new need arises.  Alexander Mt, [place] Social Worker [phone]

## 2018-07-16 NOTE — Progress Notes (Signed)
2 Days Post-Op   Subjective/Chief Complaint: Patient is much more awake and alert today Has no complaints Using Tylenol for pain Voiding well - no BM   Objective: Vital signs in last 24 hours: Temp:  [98 F (36.7 C)-98.7 F (37.1 C)] 98 F (36.7 C) (08/29 0604) Pulse Rate:  [70-79] 71 (08/29 0604) Resp:  [16] 16 (08/29 0604) BP: (100-152)/(55-78) 152/70 (08/29 0604) SpO2:  [79 %-99 %] 93 % (08/29 0604) Weight:  [57.1 kg] 57.1 kg (08/28 1945)    Intake/Output from previous day: 08/28 0701 - 08/29 0700 In: 1663.2 [P.O.:480; I.V.:1183.2] Out: 1450 [Urine:1450] Intake/Output this shift: No intake/output data recorded.  General appearance: alert, cooperative and no distress GI: soft, minimal tenderness; + BS Dressing removed - steri-strips intact; no drainage; no sign of infection  Lab Results:  Recent Labs    07/14/18 1029  WBC 5.0  HGB 11.9*  HCT 36.5  PLT 270   BMET Recent Labs    07/14/18 1029  CREATININE 0.47   PT/INR No results for input(s): LABPROT, INR in the last 72 hours. ABG No results for input(s): PHART, HCO3 in the last 72 hours.  Invalid input(s): PCO2, PO2  Studies/Results: No results found.  Anti-infectives: Anti-infectives (From admission, onward)   Start     Dose/Rate Route Frequency Ordered Stop   07/14/18 0715  vancomycin (VANCOCIN) IVPB 1000 mg/200 mL premix     1,000 mg 200 mL/hr over 60 Minutes Intravenous  Once 07/14/18 0714 07/14/18 0840   07/14/18 0714  vancomycin (VANCOCIN) 1-5 GM/200ML-% IVPB    Note to Pharmacy:  Laurita Quint   : cabinet override      07/14/18 0714 07/14/18 0740   07/14/18 0600  ceFAZolin (ANCEF) IVPB 2g/100 mL premix  Status:  Discontinued     2 g 200 mL/hr over 30 Minutes Intravenous On call to O.R. 07/14/18 0543 07/14/18 0714      Assessment/Plan: S/p open repair of recurrent ventral incisional hernia 8/27   Doing much better today Tolerating diet Using tylenol for pain  Ready for discharge  back to facility - PT felt that she might need short-term SNF.  Will reevaluate today to see if she is able to go back to assisted living.   LOS: 2 days    Raven Snyder 07/16/2018

## 2018-07-16 NOTE — Discharge Summary (Signed)
Physician Discharge Summary  Patient ID: Raven Snyder MRN: [id number] DOB/AGE: 1929/06/18 84 y.o.  Admit date: 07/14/2018 Discharge date: 07/16/2018  Admission Diagnoses:  Recurrent ventral incisional hernia  Discharge Diagnoses: same Active Problems:   Recurrent ventral incisional hernia   Discharged Condition: good  Hospital Course: Open repair of recurrent ventral incisional hernia with primary suture repair on 8/27.  On POD#1, she was quite lethargic and sleepy, but today she is awake, alert with no complaints.  Using Tylenol only for pain.  Incision healing well.  Tolerating diet.  Treatments: surgery: ventral hernia repair  Discharge Exam: Blood pressure (!) 152/70, pulse 71, temperature 98 F (36.7 C), temperature source Oral, resp. rate 16, height 4\' 10"  (1.473 m), weight 57.1 kg, SpO2 93 %. General appearance: alert, cooperative and no distress GI: soft, minimal tenderness, + BS Incison c/d/i  Disposition: Discharge disposition: [facility]  Discharge Instructions    Call MD for:  persistant nausea and vomiting   Complete by:  As directed    Call MD for:  redness, tenderness, or signs of infection (pain, swelling, redness, odor or green/yellow discharge around incision site)   Complete by:  As directed    Call MD for:  severe uncontrolled pain   Complete by:  As directed    Call MD for:  temperature >100.4   Complete by:  As directed    Diet general   Complete by:  As directed    Driving Restrictions   Complete by:  As directed    Do not drive while taking pain medications   Increase activity slowly   Complete by:  As directed    May shower / Bathe   Complete by:  As directed    No dressing needed   Complete by:  As directed    She may use the abdominal binder if she finds it helpful to decrease the soreness.     Allergies as of 07/16/2018      Reactions   Molds & Smuts Anaphylaxis   Penicillins Anaphylaxis   Morphine Other (See Comments)   Severe bladder pai   Nsaids Nausea And Vomiting, Other (See Comments)   Makes interstitial cystis symptoms worse. Bloody Urine Urine frequency Bladder/abdominal pain   Ranitidine Hcl Other (See Comments)   Patient has IC UNSPECIFIED REACTION    Tolmetin Nausea And Vomiting, Other (See Comments)   Bloody Urine Urinary Frequency Bladder pain   Accupril [quinapril Hcl]    UNSPECIFIED REACTION    Clindamycin/lincomycin    UNSPECIFIED REACTION    Mold Extract [trichophyton]    UNSPECIFIED REACTION    Morphine And Related    UNSPECIFIED REACTION    Shellfish Allergy    UNSPECIFIED REACTION    Shellfish-derived Products    UNSPECIFIED REACTION    Doxycycline Nausea And Vomiting, Rash   Sulfamethoxazole-trimethoprim Itching, Rash   Facial itching   Tramadol Nausea Only, Other (See Comments)   Makes dizzy    Yeast Nausea Only      Medication List    TAKE these medications   acetaminophen 500 MG tablet Commonly known as:  TYLENOL Take 1,000 mg by mouth See admin instructions. Scheduled 2 tablets (1000 mg) 3 times daily And once daily as needed for pain.   amiodarone 100 MG tablet Commonly known as:  PACERONE Take 100 mg daily by mouth.   ASPERCREME LIDOCAINE 4 % Liqd Generic drug:  Lidocaine HCl Apply 1 application topically 3 (  three) times daily as needed (for hip/leg pain.).   celecoxib 100 MG capsule Commonly known as:  CELEBREX Take 100 mg by mouth 2 (two) times daily. For hip pain   diclofenac sodium 1 % Gel Commonly known as:  VOLTAREN Apply 2 g topically See admin instructions. Apply 3 times daily for left knee pain & Apply 3 times daily as needed to left lumbar spine or pain.   docusate sodium 250 MG capsule Commonly known as:  COLACE Take 250 mg by mouth daily at 8 pm.   fluticasone 50 MCG/ACT nasal spray Commonly known as:  FLONASE Place 1 spray into both nostrils daily.   furosemide 20 MG tablet Commonly known as:   LASIX Take 1 tablet (20 mg total) by mouth daily. What changed:    when to take this  reasons to take this   GERI-TUSSIN 100 MG/5ML syrup Generic drug:  guaifenesin Take 400 mg by mouth every 4 (four) hours as needed for cough.   glycerin adult 2 g Supp Place 1 suppository rectally daily as needed (for constipation).   HYDROcodone-acetaminophen 5-325 MG tablet Commonly known as:  NORCO/VICODIN Take 0.5 tablets by mouth every 4 (four) hours as needed for moderate pain.   levothyroxine 100 MCG tablet Commonly known as:  SYNTHROID, LEVOTHROID Take 100 mcg daily before breakfast by mouth.   metoprolol tartrate 25 MG tablet Commonly known as:  LOPRESSOR Take 1 tablet (25 mg total) by mouth 2 (two) times daily.   mirtazapine 15 MG tablet Commonly known as:  REMERON Take 15 mg by mouth at bedtime.   nystatin cream Commonly known as:  MYCOSTATIN Apply 1 application topically 2 (two) times daily as needed for dry skin (for redness /itching).   omeprazole 20 MG capsule Commonly known as:  PRILOSEC Take 20 mg daily by mouth.   polyvinyl alcohol 1.4 % ophthalmic solution Commonly known as:  LIQUIFILM TEARS Place 1 drop into both eyes 4 (four) times daily as needed for dry eyes.   PSYLLIUM PO Take 1 capsule by mouth at bedtime.   SENOKOT 8.6 MG tablet Generic drug:  senna Take 1 tablet by mouth at bedtime.   sertraline 50 MG tablet Commonly known as:  ZOLOFT Take 50 mg by mouth daily.   sodium chloride 0.65 % Soln nasal spray Commonly known as:  OCEAN Place 1 spray into both nostrils 2 (two) times daily as needed for congestion.   sodium chloride 1 g tablet Take 1 g 2 (two) times daily by mouth.      Follow-up Information    Donnie Mesa, MD. Schedule an appointment as soon as possible for a visit in 3 week(s).   Specialty:  General Surgery Contact information: [place] [address] [phone]           Signed: Maia Petties 07/16/2018, 9:47 AM

## 2018-07-16 NOTE — Clinical Social Work Note (Signed)
Clinical Social Work Assessment  Patient Details  Name: Raven Snyder MRN: [id number] Date of Birth: [birth year]  Date of referral:  07/16/18               Reason for consult:  Facility Placement, Discharge Planning                Permission sought to share information with:  Facility Sport and exercise psychologist, Family Supports Permission granted to share information::  Yes, Verbal Permission Granted  Name::     Raven Snyder  Agency::  [facility]  Relationship::  daughter  Contact Information:  [phone]  Housing/Transportation Living arrangements for the past 2 months:  [place] of Information:  Patient Patient Interpreter Needed:  None Criminal Activity/Legal Involvement Pertinent to Current Situation/Hospitalization:  No - Comment as needed Significant Relationships:  Adult Children, Warehouse manager, Other Family Members Lives with:  Facility Resident, Spouse Do you feel safe going back to the place where you live?  Yes Need for family participation in patient care:  Yes (Comment)  Care giving concerns:  Pt from ALF, there was concern for ability to return to that level of care, pt appropriate for return.    Social Worker assessment / plan:  CSW met with pt, pt husband and pt daughter [name] at bedside. Pt daughter [name] was arranging transportation back to ALF. CSW introduced self and role, they both state understanding of visit, are amenable with return to ALF where pt has been living since transitioning from [place] in November 2018. All questions answered no additional concerns.   Employment status:  Retired Forensic scientist:  [employer] PT Recommendations:  [place], [place] / Referral to community resources:  Other (Comment Required)(ALF)  Patient/Family's Response to care:  Pt and pt family amenable to speaking with CSW, aware of CSW role and amenable tor returning to  ALF.  Patient/Family's Understanding of and Emotional Response to Diagnosis, Current Treatment, and Prognosis:  Pt and pt family express understanding of diagnosis, current treatment and prognosis. Pt daughter appears very proactive in pt's care. Pt and pt daughter are both emotionally appropriate and express reasonable expectations for pt recovery process.   Emotional Assessment Appearance:  Appears stated age Attitude/Demeanor/Rapport:  Engaged, Gracious Affect (typically observed):  Accepting, Adaptable, Appropriate, Pleasant Orientation:  Oriented to Self, Oriented to Place, Oriented to  Time, Oriented to Situation Alcohol / Substance use:  Not Applicable Psych involvement (Current and /or in the community):    No  Discharge Needs  Concerns to be addressed:  Care Coordination Readmission within the last 30 days:  No Current discharge risk:  Dependent with Mobility Barriers to Discharge:  Barriers Resolved   [place] 07/16/2018, 10:01 AM

## 2018-07-16 NOTE — Discharge Instructions (Signed)
[facility] _______[facility]  HERNIA REPAIR: POST OP INSTRUCTIONS  Always review your discharge instruction sheet given to you by the facility where your surgery was performed. IF YOU HAVE DISABILITY OR FAMILY LEAVE FORMS, YOU MUST BRING THEM TO THE OFFICE FOR PROCESSING.   DO NOT GIVE THEM TO YOUR DOCTOR.  1. You may take acetaminophen (Tylenol) or ibuprofen (Advil) as needed. 2. Take your usually prescribed medications unless otherwise directed. 3. You should follow a light diet the first 24 hours after arrival home, such as soup and crackers, etc.  Be sure to include lots of fluids daily.  Resume your normal diet the day after surgery. 4.Most patients will experience some swelling and bruising around the incision.  Ice packs and reclining will help.  Swelling and bruising can take several days to resolve.  6. It is common to experience some constipation if taking pain medication after surgery.  Increasing fluid intake and taking a stool softener (such as Colace) will usually help or prevent this problem from occurring.  A mild laxative (Milk of Magnesia or Miralax) should be taken according to package directions if there are no bowel movements after 48 hours. 7. Unless discharge instructions indicate otherwise, you may remove your bandages 24-48 hours after surgery, and you may shower at that time.  You may have steri-strips (small skin tapes) in place directly over the incision.  These strips should be left on the skin for 7-10 days.  8. ACTIVITIES:  You may resume regular (light) daily activities beginning the next day--such as daily self-care, walking, climbing stairs--gradually increasing activities as tolerated.  You may have sexual intercourse when it is comfortable.  Refrain from any heavy lifting or straining until approved by your doctor.  a.You may drive when you are no longer taking prescription pain medication, you can comfortably wear a seatbelt, and you can safely maneuver  your car and apply brakes. b.RETURN TO WORK:   _____________________________________________  9.You should see your doctor in the office for a follow-up appointment approximately 2-3 weeks after your surgery.  Make sure that you call for this appointment within a day or two after you arrive home to insure a convenient appointment time. 10.OTHER INSTRUCTIONS: _________________________    _____________________________________  WHEN TO CALL YOUR DOCTOR: 1. Fever over 101.0 2. Inability to urinate 3. Nausea and/or vomiting 4. Extreme swelling or bruising 5. Continued bleeding from incision. 6. Increased pain, redness, or drainage from the incision  The clinic staff is available to answer your questions during regular business hours.  Please dont hesitate to call and ask to speak to one of the nurses for clinical concerns.  If you have a medical emergency, go to the nearest emergency room or call 911.  A surgeon from [facility] is always on call at the hospital   [address] [place], [place], [place]  [address] ?  [address], [place], [place]   [address] [phone] ? [phone] ? FAX [phone] Web site: [email or web address]

## 2018-07-16 NOTE — [place] FL2 (Signed)
[place] MEDICAID FL2 LEVEL OF CARE SCREENING TOOL     IDENTIFICATION  Patient Name: Raven Snyder Birthdate: 02-Jun-1929 Sex: female Admission Date (Current Location): 07/14/2018  [facility] and [place] Number:  Herbalist and Address:  [facility] [place] [address] [place], [place] [address]      Provider Number: [id number]  Attending Physician Name and Address:  Donnie Mesa, MD  Relative Name and Phone Number:  [name]; daughter; [phone]    Current Level of Care: Hospital Recommended Level of Care: [place] Prior Approval Number:    Date Approved/Denied:   PASRR Number: [id number]  Discharge Plan: Other (Comment)(Assisted Living)    Current Diagnoses: Patient Active Problem List   Diagnosis Date Noted  . Recurrent ventral incisional hernia 07/14/2018  . Acute blood loss as cause of postoperative anemia 10/19/2017  . Diastolic congestive heart failure ([place]) 10/19/2017  . Sinus tachycardia 10/19/2017  . GERD (gastroesophageal reflux disease) 10/19/2017  . Constipation 10/12/2017  . Left displaced femoral neck fracture ([place]) 10/08/2017  . Hypothyroidism 10/08/2017  . Escherichia coli urinary tract infection 10/08/2017  . Anxiety 10/08/2017  . AF (paroxysmal atrial fibrillation) ([place]) 10/08/2017  . Acute on chronic respiratory failure with hypoxia ([place]) 10/08/2017  . SVT (supraventricular tachycardia) ([place]) 10/08/2017    Orientation RESPIRATION BLADDER Height & Weight     Self, Time, Situation, Place  Normal Incontinent Weight: 125 lb 14.1 oz (57.1 kg) Height:  4\' 10"  (147.3 cm)  BEHAVIORAL SYMPTOMS/MOOD NEUROLOGICAL BOWEL NUTRITION STATUS      Continent Diet(regular diet)  AMBULATORY STATUS COMMUNICATION OF NEEDS Skin   Extensive Assist Verbally Other (Comment), Surgical wounds(incision on abdomen no dressing changes; MASD on groin )                       Personal Care Assistance Level of  Assistance  Bathing, Feeding, Dressing Bathing Assistance: Limited assistance Feeding assistance: Independent Dressing Assistance: Limited assistance     Functional Limitations Info  Sight, Hearing, Speech Sight Info: Adequate Hearing Info: Adequate Speech Info: Adequate    SPECIAL CARE FACTORS FREQUENCY        PT Frequency: 3x week OT Frequency: 2x week            Contractures Contractures Info: Not present    Additional Factors Info  Code Status, Allergies, Psychotropic Code Status Info: Full Code Allergies Info: MOLDS & SMUTS, PENICILLINS, MORPHINE, NSAIDS, RANITIDINE HCL, TOLMETIN, ACCUPRIL QUINAPRIL HCL, CLINDAMYCIN/LINCOMYCIN, MOLD EXTRACT TRICHOPHYTON, MORPHINE AND RELATED, SHELLFISH ALLERGY, SHELLFISH-DERIVED PRODUCTS, DOXYCYCLINE, SULFAMETHOXAZOLE-TRIMETHOPRIM, TRAMADOL, YEAST  Psychotropic Info: mirtazapine (REMERON) tablet 15 mg daily at bedtime PO; sertraline (ZOLOFT) tablet 50 mg daily PO; celecoxib (CELEBREX) capsule 100 mg 2x daily PO         Current Medications (07/16/2018):  This is the current hospital active medication list Current Facility-Administered Medications  Medication Dose Route Frequency Provider Last Rate Last Dose  . 0.9 %  sodium chloride infusion  250 mL Intravenous PRN Donnie Mesa, MD      . acetaminophen (TYLENOL) tablet 1,000 mg  1,000 mg Oral Q6H Donnie Mesa, MD   1,000 mg at 07/16/18 0535  . amiodarone (PACERONE) tablet 100 mg  100 mg Oral Daily Donnie Mesa, MD   100 mg at 07/15/18 1137  . celecoxib (CELEBREX) capsule 100 mg  100 mg Oral BID Donnie Mesa, MD   100 mg at 07/15/18 2037  . diclofenac sodium (VOLTAREN) 1 % transdermal gel 2  g  2 g Topical See admin instructions Donnie Mesa, MD   2 g at 07/15/18 1740  . diphenhydrAMINE (BENADRYL) 12.5 MG/5ML elixir 12.5 mg  12.5 mg Oral Q6H PRN Donnie Mesa, MD       Or  . diphenhydrAMINE (BENADRYL) injection 12.5 mg  12.5 mg Intravenous Q6H PRN Donnie Mesa, MD      .  docusate sodium (COLACE) capsule 250 mg  250 mg Oral Q2000 Donnie Mesa, MD   250 mg at 07/15/18 1949  . enoxaparin (LOVENOX) injection 30 mg  30 mg Subcutaneous Q24H Donnie Mesa, MD   30 mg at 07/16/18 0753  . fluticasone (FLONASE) 50 MCG/ACT nasal spray 1 spray  1 spray Each Nare Daily Donnie Mesa, MD   1 spray at 07/15/18 1140  . furosemide (LASIX) tablet 20 mg  20 mg Oral Daily Donnie Mesa, MD   20 mg at 07/15/18 1137  . levothyroxine (SYNTHROID, LEVOTHROID) tablet 100 mcg  100 mcg Oral QAC breakfast Donnie Mesa, MD   100 mcg at 07/16/18 0753  . mirtazapine (REMERON) tablet 15 mg  15 mg Oral QHS Donnie Mesa, MD   15 mg at 07/15/18 2036  . ondansetron (ZOFRAN-ODT) disintegrating tablet 4 mg  4 mg Oral Q6H PRN Donnie Mesa, MD       Or  . ondansetron (ZOFRAN) injection 4 mg  4 mg Intravenous Q6H PRN Donnie Mesa, MD      . pantoprazole (PROTONIX) EC tablet 40 mg  40 mg Oral Daily Donnie Mesa, MD   40 mg at 07/15/18 1142  . sertraline (ZOLOFT) tablet 50 mg  50 mg Oral Daily Donnie Mesa, MD   50 mg at 07/15/18 1138  . sodium chloride flush (NS) 0.9 % injection 3 mL  3 mL Intravenous Q12H Donnie Mesa, MD      . sodium chloride flush (NS) 0.9 % injection 3 mL  3 mL Intravenous PRN Donnie Mesa, MD      . traMADol Veatrice [place]) tablet 50 mg  50 mg Oral Q6H PRN Donnie Mesa, MD   50 mg at 07/14/18 1843     Discharge Medications: Please see discharge summary for a list of discharge medications.  Relevant Imaging Results:  Relevant Lab Results:   Additional Information SS#[id number]  [place], [place]

## 2018-07-31 ENCOUNTER — Other Ambulatory Visit: Payer: Self-pay | Admitting: Surgery

## 2018-08-15 ENCOUNTER — Emergency Department (HOSPITAL_COMMUNITY): Payer: Medicare Other

## 2018-08-15 ENCOUNTER — Encounter (HOSPITAL_COMMUNITY): Payer: Self-pay | Admitting: Emergency Medicine

## 2018-08-15 ENCOUNTER — Emergency Department (HOSPITAL_COMMUNITY)
Admission: EM | Admit: 2018-08-15 | Discharge: 2018-08-16 | Disposition: A | Discharge status: Skilled Nursing Facility | Payer: Medicare Other | Attending: Emergency Medicine | Admitting: Emergency Medicine

## 2018-08-15 DIAGNOSIS — Z85038 Personal history of other malignant neoplasm of large intestine: Secondary | ICD-10-CM | POA: Insufficient documentation

## 2018-08-15 DIAGNOSIS — S72002K Fracture of unspecified part of neck of left femur, subsequent encounter for closed fracture with nonunion: Secondary | ICD-10-CM

## 2018-08-15 DIAGNOSIS — M25552 Pain in left hip: Secondary | ICD-10-CM | POA: Diagnosis not present

## 2018-08-15 DIAGNOSIS — E039 Hypothyroidism, unspecified: Secondary | ICD-10-CM | POA: Insufficient documentation

## 2018-08-15 DIAGNOSIS — F0390 Unspecified dementia without behavioral disturbance: Secondary | ICD-10-CM | POA: Diagnosis not present

## 2018-08-15 DIAGNOSIS — Y939 Activity, unspecified: Secondary | ICD-10-CM | POA: Diagnosis not present

## 2018-08-15 DIAGNOSIS — Z79899 Other long term (current) drug therapy: Secondary | ICD-10-CM | POA: Diagnosis not present

## 2018-08-15 DIAGNOSIS — X58XXXD Exposure to other specified factors, subsequent encounter: Secondary | ICD-10-CM | POA: Insufficient documentation

## 2018-08-15 DIAGNOSIS — G8929 Other chronic pain: Secondary | ICD-10-CM | POA: Diagnosis not present

## 2018-08-15 HISTORY — DX: Unspecified dementia, unspecified severity, without behavioral disturbance, psychotic disturbance, mood disturbance, and anxiety: F03.90

## 2018-08-15 MED ORDER — FENTANYL CITRATE (PF) 100 MCG/2ML IJ SOLN
50.0000 ug | Freq: Once | INTRAMUSCULAR | Status: AC
  Administered 2018-08-16: 50 ug via INTRAVENOUS
  Filled 2018-08-15: qty 2

## 2018-08-15 NOTE — ED Notes (Signed)
Bed: [facility] Expected date:  Expected time:  Means of arrival:  Comments: 84 yo F/SNF hip pain- no injury

## 2018-08-15 NOTE — ED Triage Notes (Signed)
She had hip fx a year ago had sx but never healed right , so having pain in left side.  Take voltren and oxir with no relief, Alert and oriented with intermittent confusion, hx of dementia, tachycardia, GERD,

## 2018-08-15 NOTE — ED Provider Notes (Signed)
[facility] DEPT Provider Note: Georgena Spurling, MD, FACEP  CSN: [id number] MRN: [id number] ARRIVAL: 08/15/18 at [place]: [place]  Hip Pain (left hip pain)  Level 5 caveat: Dementia HISTORY OF PRESENT ILLNESS  08/15/18 11:01 PM [facility] is a 84 y.o. female who had a left hip fracture in November of last year.  She states it has never completely healed properly and she has had chronic pain associated with it.  The pain has gotten to the point where she has been unable to ambulate even with a walker for several months and she now uses a wheelchair.  The pain has worsened over the past 2 or 3 days despite no new injury.  The pain is worse with certain movements and certain positions.  She describes the pain is severe at times making sleeping difficult.  She states the left foot always feels cold.    Past Medical History:  Diagnosis Date  . Anxiety   . colon ca dx'd 1999  . Complication of anesthesia   . Dementia   . History of kidney stones   . Hypothyroidism   . PONV (postoperative nausea and vomiting)   . Recurrent ventral incisional hernia   . SVT (supraventricular tachycardia) (HCC)     Past Surgical History:  Procedure Laterality Date  . ABDOMINAL HERNIA REPAIR  07/14/2018   OPEN REPAIR OF RECURRENT VENTRAL HERNIA  . CARDIAC CATHETERIZATION    . CATARACT EXTRACTION, BILATERAL    . EYE SURGERY    . EYE SURGERY     Bilateral eye lids raised  . FRACTURE SURGERY    . HERNIA REPAIR    . HIP PINNING,CANNULATED Left 10/08/2017   Procedure: CANNULATED HIP PINNING;  Surgeon: Gaynelle Arabian, MD;  Location: [facility];  Service: Orthopedics;  Laterality: Left;  . THYROIDECTOMY, PARTIAL    . VENTRAL HERNIA REPAIR N/A 07/14/2018   Procedure: OPEN REPAIR OF RECURRENT VENTRAL HERNIA;  Surgeon: Donnie Mesa, MD;  Location: [facility];  Service: General;  Laterality: N/A;    No family history on file.  Social History   Tobacco Use  . Smoking status: Never  Smoker  . Smokeless tobacco: Never Used  Substance Use Topics  . Alcohol use: No    Frequency: Never  . Drug use: No    Prior to Admission medications   Medication Sig Start Date End Date Taking? Authorizing Provider  acetaminophen (TYLENOL) 500 MG tablet Take 1,000 mg by mouth See admin instructions. Scheduled 2 tablets (1000 mg) 3 times daily And once daily as needed for pain.    [provider]  amiodarone (PACERONE) 100 MG tablet Take 100 mg daily by mouth.    [provider]  ASPERCREME LIDOCAINE 4 % LIQD Apply 1 application topically 3 (three) times daily as needed (for hip/leg pain.).    [provider]  celecoxib (CELEBREX) 100 MG capsule Take 100 mg by mouth 2 (two) times daily. For hip pain    [provider]  diclofenac sodium (VOLTAREN) 1 % GEL Apply 2 g topically See admin instructions. Apply 3 times daily for left knee pain & Apply 3 times daily as needed to left lumbar spine or pain.    [provider]  docusate sodium (COLACE) 250 MG capsule Take 250 mg by mouth daily at 8 pm.    [provider]  fluticasone (FLONASE) 50 MCG/ACT nasal spray Place 1 spray into both nostrils daily.    [provider]  furosemide (LASIX) 20 MG tablet Take 1 tablet (20 mg total) by mouth daily. Patient taking differently: Take 20 mg by mouth daily as needed (for fluid retention).  10/12/17   Charlynne Cousins, MD  Glycerin, Laxative, (GLYCERIN ADULT) 2 g SUPP Place 1 suppository rectally daily as needed (for constipation).    [provider]  guaifenesin (GERI-TUSSIN) 100 MG/5ML syrup Take 400 mg by mouth every 4 (four) hours as needed for cough.    [provider]  HYDROcodone-acetaminophen (NORCO/VICODIN) 5-325 MG tablet Take 0.5 tablets by mouth every 4 (four) hours as needed for moderate pain.    [provider]  levothyroxine (SYNTHROID, LEVOTHROID) 100 MCG tablet Take 100 mcg daily before breakfast by  mouth.    [provider]  metoprolol tartrate (LOPRESSOR) 25 MG tablet Take 1 tablet (25 mg total) by mouth 2 (two) times daily. Patient not taking: Reported on 06/24/2018 10/12/17   Charlynne Cousins, MD  mirtazapine (REMERON) 15 MG tablet Take 15 mg by mouth at bedtime.    [provider]  nystatin cream (MYCOSTATIN) Apply 1 application topically 2 (two) times daily as needed for dry skin (for redness /itching).    [provider]  omeprazole (PRILOSEC) 20 MG capsule Take 20 mg daily by mouth.    [provider]  polyvinyl alcohol (LIQUIFILM TEARS) 1.4 % ophthalmic solution Place 1 drop into both eyes 4 (four) times daily as needed for dry eyes.    [provider]  PSYLLIUM PO Take 1 capsule by mouth at bedtime.    [provider]  senna (SENOKOT) 8.6 MG tablet Take 1 tablet by mouth at bedtime.     [provider]  sertraline (ZOLOFT) 50 MG tablet Take 50 mg by mouth daily.    [provider]  sodium chloride (OCEAN) 0.65 % SOLN nasal spray Place 1 spray into both nostrils 2 (two) times daily as needed for congestion.    [provider]  sodium chloride 1 g tablet Take 1 g 2 (two) times daily by mouth.    [provider]    Allergies Molds & smuts; Penicillins; Morphine; Nsaids; Ranitidine hcl; Tolmetin; Accupril [quinapril hcl]; Clindamycin/lincomycin; Mold extract [trichophyton]; Morphine and related; Shellfish allergy; Shellfish-derived products; Doxycycline; Sulfamethoxazole-trimethoprim; Tramadol; and Yeast   REVIEW OF SYSTEMS     PHYSICAL EXAMINATION  Initial Vital Signs Blood pressure (!) 191/80, pulse 70, temperature 98.1 F (36.7 C), temperature source Oral, resp. rate 18, SpO2 95 %.  Examination General: Well-developed, well-nourished female in no acute distress; appearance consistent with age of record HENT: normocephalic; atraumatic Eyes: Arcus senilis bilaterally Neck:  supple Heart: regular rate and rhythm Lungs: clear to auscultation bilaterally Abdomen: soft; nondistended; nontender; no masses or hepatosplenomegaly; bowel sounds present Extremities: Left lower extremity shortened and externally rotated; deformity and tenderness of left hip with pain on attempted passive range of motion; pulses normal except for slightly decreased left dorsalis pedis and posterior tibial pulses Neurologic: Awake, alert and oriented to person, place, date, month but not year; motor function intact in all extremities and symmetric; no facial droop Skin: Warm and dry Psychiatric: Flat affect   RESULTS  Summary of this visit's results, reviewed by myself:   EKG Interpretation  Date/Time:    Ventricular Rate:    PR Interval:    QRS Duration:   QT Interval:    QTC Calculation:   R Axis:     Text Interpretation:  Laboratory Studies: No results found for this or any previous visit (from the past 24 hour(s)). Imaging Studies: Dg Chest 1 View  Result Date: 08/15/2018 CLINICAL DATA:  Increasing pain since left hip surgery 1 year ago EXAM: CHEST  1 VIEW COMPARISON:  11/20/2017 FINDINGS: Mild patchy bibasilar opacities, likely atelectasis. Increased interstitial markings without frank interstitial edema. No pleural effusions or pneumothorax. The heart is top-normal in size. Calcified right mediastinal nodes. IMPRESSION: Mild patchy bibasilar opacities, likely atelectasis. Electronically Signed   By: Julian Hy M.D.   On: 08/15/2018 23:59   Dg Hip Unilat W Or Wo Pelvis 2-3 Views Left  Result Date: 08/15/2018 CLINICAL DATA:  Increasing left hip pain status post ORIF 1 year ago EXAM: DG HIP (WITH OR WITHOUT PELVIS) 2-3V LEFT COMPARISON:  CT abdomen/pelvis dated 06/12/2018. Intraoperative radiographs dated 10/08/2017. 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 This is chronic when compared to prior CT. At the time of prior CT, 1 of the 3  cannulated cancellous screws was still present within the femoral head, 1 was malpositioned within the acetabulum, while the 3rd was loose. At this time, all 3 appear to be malpositioned/loose. There is proximal migration/foreshortening of the proximal femoral shaft relative to the femoral head component, with pseudoarthrosis. Lucency within the left acetabulum, likely related to the prior malpositioned cancellous screw. Status post ORIF of the right hip, without evidence of complication. Visualized bony pelvis appears intact. Degenerative changes the lower lumbar spine. Vascular calcifications. 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 All 3 cancellous screws have now backed out of the femoral head. Proximal migration of the femoral shaft relative to the femoral head component, with pseudoarthrosis. Orthopedic consultation is suggested. Electronically Signed   By: Julian Hy M.D.   On: 08/15/2018 23:58    ED COURSE and MDM  Nursing notes and initial vitals signs, including pulse oximetry, reviewed.  Vitals:   08/16/18 0145 08/16/18 0200 08/16/18 0230 08/16/18 0530  BP:    (!) 165/83  Pulse: 85 76 77 81  Resp:      Temp:      TempSrc:      SpO2: 97% 95% 98% 91%   12:45 AM Discussed with Dr. Lyla Glassing who reviewed Dr. Anne Fu records.  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  She has been deemed a poor surgical candidate and declined surgery in the past.  The issue was discussed with her daughter who is healthcare power of attorney.  She states the issue is really 1 of pain control.  She is currently on a Vicodin, 1/2 tablet every 8 hours as needed.  She has difficulty tolerating higher doses of hydrocodone and if her dosage was to be increased she may need placement in skilled nursing as opposed to assisted living.  The daughter states her primary care physician is able to help facilitate this if needed.  We will attempt to get her pain better  controlled in the ED.  6:00 AM Patient appears ready to return to her nursing home.  We will have her primary care physician deal with her pain management and possible placement in skilled nursing.   PROCEDURES    ED DIAGNOSES     ICD-10-CM   1. Chronic left hip pain M25.552    G89.29   2. Hip fracture, left, closed, with nonunion, subsequent encounter S72.002K        Blakelynn Scheeler, Jenny Reichmann, MD 08/16/18 [phone]

## 2018-08-16 DIAGNOSIS — S72002K Fracture of unspecified part of neck of left femur, subsequent encounter for closed fracture with nonunion: Secondary | ICD-10-CM | POA: Diagnosis not present

## 2018-08-16 NOTE — ED Notes (Signed)
[facility] called for transport.  

## 2018-09-04 ENCOUNTER — Other Ambulatory Visit: Payer: Self-pay | Admitting: *Deleted

## 2018-09-04 DIAGNOSIS — R0602 Shortness of breath: Secondary | ICD-10-CM

## 2018-09-25 ENCOUNTER — Ambulatory Visit (INDEPENDENT_AMBULATORY_CARE_PROVIDER_SITE_OTHER): Payer: Medicare Other | Admitting: Internal Medicine

## 2018-09-25 DIAGNOSIS — R0602 Shortness of breath: Secondary | ICD-10-CM | POA: Diagnosis not present

## 2018-09-25 LAB — PULMONARY FUNCTION TEST
FEF 25-75 PRE: 1.37 L/s
FEF 25-75 Post: 2.15 L/sec
FEF2575-%Change-Post: 57 %
FEF2575-%PRED-POST: 281 %
FEF2575-%Pred-Pre: 178 %
FEV1-%Change-Post: 9 %
FEV1-%PRED-POST: 116 %
FEV1-%PRED-PRE: 106 %
FEV1-POST: 1.43 L
FEV1-Pre: 1.31 L
FEV1FVC-%Change-Post: 4 %
FEV1FVC-%PRED-PRE: 113 %
FEV6-%Change-Post: 4 %
FEV6-%PRED-POST: 106 %
FEV6-%PRED-PRE: 101 %
FEV6-POST: 1.67 L
FEV6-Pre: 1.59 L
FEV6FVC-%PRED-POST: 108 %
FEV6FVC-%Pred-Pre: 108 %
FVC-%Change-Post: 4 %
FVC-%Pred-Post: 97 %
FVC-%Pred-Pre: 93 %
FVC-Post: 1.67 L
FVC-Pre: 1.59 L
POST FEV6/FVC RATIO: 100 %
PRE FEV1/FVC RATIO: 82 %
PRE FEV6/FVC RATIO: 100 %
Post FEV1/FVC ratio: 86 %

## 2018-09-25 NOTE — Progress Notes (Signed)
PFT completed today. DLCO and Lung Volumes could not be completed as the patient could not transfer from her wheelchair to the PFT booth.

## 2018-10-22 ENCOUNTER — Emergency Department (HOSPITAL_COMMUNITY): Payer: Medicare Other

## 2018-10-22 ENCOUNTER — Emergency Department (HOSPITAL_COMMUNITY)
Admission: EM | Admit: 2018-10-22 | Discharge: 2018-10-22 | Disposition: A | Discharge status: Home / Self Care | Payer: Medicare Other | Attending: Emergency Medicine | Admitting: Emergency Medicine

## 2018-10-22 ENCOUNTER — Encounter (HOSPITAL_COMMUNITY): Payer: Self-pay | Admitting: *Deleted

## 2018-10-22 DIAGNOSIS — R6883 Chills (without fever): Secondary | ICD-10-CM | POA: Insufficient documentation

## 2018-10-22 DIAGNOSIS — Z85038 Personal history of other malignant neoplasm of large intestine: Secondary | ICD-10-CM | POA: Diagnosis not present

## 2018-10-22 DIAGNOSIS — R112 Nausea with vomiting, unspecified: Secondary | ICD-10-CM | POA: Insufficient documentation

## 2018-10-22 DIAGNOSIS — E039 Hypothyroidism, unspecified: Secondary | ICD-10-CM | POA: Diagnosis not present

## 2018-10-22 DIAGNOSIS — R55 Syncope and collapse: Secondary | ICD-10-CM | POA: Insufficient documentation

## 2018-10-22 DIAGNOSIS — Z79899 Other long term (current) drug therapy: Secondary | ICD-10-CM | POA: Insufficient documentation

## 2018-10-22 DIAGNOSIS — F0390 Unspecified dementia without behavioral disturbance: Secondary | ICD-10-CM | POA: Insufficient documentation

## 2018-10-22 LAB — CBC WITH DIFFERENTIAL/PLATELET
Abs Immature Granulocytes: 0.02 10*3/uL (ref 0.00–0.07)
Basophils Absolute: 0 10*3/uL (ref 0.0–0.1)
Basophils Relative: 1 %
EOS PCT: 0 %
Eosinophils Absolute: 0 10*3/uL (ref 0.0–0.5)
HCT: 45.5 % (ref 36.0–46.0)
Hemoglobin: 14.5 g/dL (ref 12.0–15.0)
Immature Granulocytes: 0 %
Lymphocytes Relative: 17 %
Lymphs Abs: 0.9 10*3/uL (ref 0.7–4.0)
MCH: 30.1 pg (ref 26.0–34.0)
MCHC: 31.9 g/dL (ref 30.0–36.0)
MCV: 94.6 fL (ref 80.0–100.0)
Monocytes Absolute: 0.4 10*3/uL (ref 0.1–1.0)
Monocytes Relative: 7 %
Neutro Abs: 3.9 10*3/uL (ref 1.7–7.7)
Neutrophils Relative %: 75 %
Platelets: 227 10*3/uL (ref 150–400)
RBC: 4.81 MIL/uL (ref 3.87–5.11)
RDW: 13.4 % (ref 11.5–15.5)
WBC: 5.3 10*3/uL (ref 4.0–10.5)
nRBC: 0 % (ref 0.0–0.2)

## 2018-10-22 LAB — COMPREHENSIVE METABOLIC PANEL
ALBUMIN: 4.7 g/dL (ref 3.5–5.0)
ALT: 13 U/L (ref 0–44)
AST: 26 U/L (ref 15–41)
Alkaline Phosphatase: 74 U/L (ref 38–126)
Anion gap: 15 (ref 5–15)
BUN: 8 mg/dL (ref 8–23)
CO2: 22 mmol/L (ref 22–32)
Calcium: 10 mg/dL (ref 8.9–10.3)
Chloride: 96 mmol/L — ABNORMAL LOW (ref 98–111)
Creatinine, Ser: 0.79 mg/dL (ref 0.44–1.00)
GFR calc Af Amer: >60 mL/min (ref >60)
GFR calc non Af Amer: >60 mL/min (ref >60)
Glucose, Bld: 120 mg/dL — ABNORMAL HIGH (ref 70–99)
Potassium: 4 mmol/L (ref 3.5–5.1)
SODIUM: 133 mmol/L — AB (ref 135–145)
Total Bilirubin: 0.9 mg/dL (ref 0.3–1.2)
Total Protein: 8.2 g/dL — ABNORMAL HIGH (ref 6.5–8.1)

## 2018-10-22 LAB — URINALYSIS, COMPLETE (UACMP) WITH MICROSCOPIC
Bilirubin Urine: NEGATIVE
GLUCOSE, UA: NEGATIVE mg/dL
Ketones, ur: NEGATIVE mg/dL
Leukocytes, UA: NEGATIVE
Nitrite: NEGATIVE
PROTEIN: NEGATIVE mg/dL
Specific Gravity, Urine: 1.008 (ref 1.005–1.030)
pH: 7 (ref 5.0–8.0)

## 2018-10-22 LAB — CBG MONITORING, ED: Glucose-Capillary: 96 mg/dL (ref 70–99)

## 2018-10-22 MED ORDER — SODIUM CHLORIDE 0.9 % IV BOLUS
500.0000 mL | Freq: Once | INTRAVENOUS | Status: AC
  Administered 2018-10-22: 500 mL via INTRAVENOUS

## 2018-10-22 NOTE — ED Notes (Signed)
Dr. Delo at bedside. 

## 2018-10-22 NOTE — ED Notes (Signed)
Pt and husband verbalized understanding of d/c instructions and has no fufther questions, VSS, NAD. Pt sent back to [facility]. They are coming to pick pt up. Pt taken to lobby by this RN and sitting with family waiting for ride.

## 2018-10-22 NOTE — ED Notes (Signed)
Pt provided with sandwich bag and drink.

## 2018-10-22 NOTE — ED Provider Notes (Signed)
[facility] EMERGENCY DEPARTMENT Provider Note   CSN: [id number] Arrival date & time: 10/22/18  9323   History   Chief Complaint Chief Complaint  Patient presents with  . Loss of Consciousness    HPI [facility] is a 84 y.o. female who presents with syncope.  Past medical history significant for mild dementia, anxiety, hypothyroidism, chronic left hip fracture with nonunion.  The patient is confused and cannot provide a coherent history.  Patient's husband is at bedside and states over the past several days she has had difficulty with getting her words out.  He thought she may have had a small stroke.  She has not had any extremity weakness or facial droop.  She was treated for a UTI 1-2 weeks ago. She also had several episodes of nausea and vomiting after taking her medicines.  She has been more tremulous than normal and confused.  This morning the patient had a coughing fit and her husband gave her cough medicine and took her to the bathroom.  She soon as she sat on the toilet she had a syncopal episode and went unresponsive.  She did not fall or have an injury.  Her husband estimates she was unconscious for approximately 5 to 10 minutes.  He states she had several other syncopal episodes the past couple days as well. EMS was called and she was transported.  Her husband denies that she has had a fever.  No diarrhea or urinary symptoms.  The patient denies any pain or specific complaints.  She states that she just does not feel well. She is DNR.  HPI  Past Medical History:  Diagnosis Date  . Anxiety   . colon ca dx'd 1999  . Complication of anesthesia   . Dementia ([place])   . History of kidney stones   . Hypothyroidism   . PONV (postoperative nausea and vomiting)   . Recurrent ventral incisional hernia   . SVT (supraventricular tachycardia) [facility])     Patient Active Problem List   Diagnosis Date Noted  . Recurrent ventral incisional hernia 07/14/2018  . Acute  blood loss as cause of postoperative anemia 10/19/2017  . Diastolic congestive heart failure ([place]) 10/19/2017  . Sinus tachycardia 10/19/2017  . GERD (gastroesophageal reflux disease) 10/19/2017  . Constipation 10/12/2017  . Left displaced femoral neck fracture ([place]) 10/08/2017  . Hypothyroidism 10/08/2017  . Escherichia coli urinary tract infection 10/08/2017  . Anxiety 10/08/2017  . AF (paroxysmal atrial fibrillation) ([place]) 10/08/2017  . Acute on chronic respiratory failure with hypoxia ([place]) 10/08/2017  . SVT (supraventricular tachycardia) ([place]) 10/08/2017    Past Surgical History:  Procedure Laterality Date  . ABDOMINAL HERNIA REPAIR  07/14/2018   OPEN REPAIR OF RECURRENT VENTRAL HERNIA  . CARDIAC CATHETERIZATION    . CATARACT EXTRACTION, BILATERAL    . EYE SURGERY    . EYE SURGERY     Bilateral eye lids raised  . FRACTURE SURGERY    . HERNIA REPAIR    . HIP PINNING,CANNULATED Left 10/08/2017   Procedure: CANNULATED HIP PINNING;  Surgeon: Gaynelle Arabian, MD;  Location: [facility];  Service: Orthopedics;  Laterality: Left;  . THYROIDECTOMY, PARTIAL    . VENTRAL HERNIA REPAIR N/A 07/14/2018   Procedure: OPEN REPAIR OF RECURRENT VENTRAL HERNIA;  Surgeon: Donnie Mesa, MD;  Location: [facility];  Service: General;  Laterality: N/A;     OB History   None      Home Medications    Prior to Admission medications  Medication Sig Start Date End Date Taking? Authorizing Provider  acetaminophen (TYLENOL) 500 MG tablet Take 1,000 mg by mouth See admin instructions. Scheduled 2 tablets (1000 mg) 3 times daily And once daily as needed for pain.    [provider]  amiodarone (PACERONE) 100 MG tablet Take 100 mg daily by mouth.    [provider]  ASPERCREME LIDOCAINE 4 % LIQD Apply 1 application topically 3 (three) times daily as needed (for hip/leg pain.).    [provider]  celecoxib (CELEBREX) 100 MG capsule Take 100 mg by mouth 2 (two) times daily. For hip  pain    [provider]  diclofenac sodium (VOLTAREN) 1 % GEL Apply 2 g topically See admin instructions. Apply 3 times daily for left knee pain & Apply 3 times daily as needed to left lumbar spine or pain.    [provider]  docusate sodium (COLACE) 250 MG capsule Take 250 mg by mouth daily at 8 pm.    [provider]  fluticasone (FLONASE) 50 MCG/ACT nasal spray Place 1 spray into both nostrils daily.    [provider]  furosemide (LASIX) 20 MG tablet Take 1 tablet (20 mg total) by mouth daily. Patient taking differently: Take 20 mg by mouth daily as needed (for fluid retention).  10/12/17   Charlynne Cousins, MD  Glycerin, Laxative, (GLYCERIN ADULT) 2 g SUPP Place 1 suppository rectally daily as needed (for constipation).    [provider]  guaifenesin (GERI-TUSSIN) 100 MG/5ML syrup Take 400 mg by mouth every 4 (four) hours as needed for cough.    [provider]  HYDROcodone-acetaminophen (NORCO/VICODIN) 5-325 MG tablet Take 0.5 tablets by mouth every 4 (four) hours as needed for moderate pain.    [provider]  levothyroxine (SYNTHROID, LEVOTHROID) 100 MCG tablet Take 100 mcg daily before breakfast by mouth.    [provider]  mirtazapine (REMERON) 15 MG tablet Take 15 mg by mouth at bedtime.    [provider]  nystatin cream (MYCOSTATIN) Apply 1 application topically 2 (two) times daily as needed for dry skin (for redness /itching).    [provider]  omeprazole (PRILOSEC) 20 MG capsule Take 20 mg daily by mouth.    [provider]  polyvinyl alcohol (LIQUIFILM TEARS) 1.4 % ophthalmic solution Place 1 drop into both eyes 4 (four) times daily as needed for dry eyes.    [provider]  PSYLLIUM PO Take 1 capsule by mouth at bedtime.    [provider]  senna (SENOKOT) 8.6 MG tablet Take 1 tablet by mouth at bedtime.     [provider]  sertraline (ZOLOFT) 50  MG tablet Take 50 mg by mouth daily.    [provider]  sodium chloride (OCEAN) 0.65 % SOLN nasal spray Place 1 spray into both nostrils 2 (two) times daily as needed for congestion.    [provider]  sodium chloride 1 g tablet Take 1 g 2 (two) times daily by mouth.    [provider]    Family History No family history on file.  Social History Social History   Tobacco Use  . Smoking status: Never Smoker  . Smokeless tobacco: Never Used  Substance Use Topics  . Alcohol use: No    Frequency: Never  . Drug use: No     Allergies   Molds & smuts; Penicillins; Morphine; Nsaids; Ranitidine hcl; Tolmetin; Accupril [quinapril hcl]; Clindamycin/lincomycin; Mold extract [trichophyton]; Morphine and  related; Shellfish allergy; Shellfish-derived products; Doxycycline; Sulfamethoxazole-trimethoprim; Tramadol; and Yeast   Review of Systems Review of Systems  Unable to perform ROS: Mental status change  Constitutional: Negative for fever.     Physical Exam Updated Vital Signs BP (!) (P) 146/94 (BP Location: Right Arm) Comment: Simultaneous filing. User may not have seen previous data.  Pulse 87 Comment: Simultaneous filing. User may not have seen previous data.  Temp 98.6 F (37 C) (Rectal)   Resp (P) 17   SpO2 100%   Physical Exam  Constitutional: She appears well-developed and well-nourished. No distress.  Calm and cooperative. Mildly anxious and tremulous. Confused and not able to give a coherent history  HENT:  Head: Normocephalic and atraumatic.  Eyes: Pupils are equal, round, and reactive to light. Conjunctivae are normal. Right eye exhibits no discharge. Left eye exhibits no discharge. No scleral icterus.  Pinpoint  Neck: Normal range of motion.  Cardiovascular: Normal rate and regular rhythm.  Pulmonary/Chest: Effort normal and breath sounds normal. No respiratory distress.  Abdominal: Soft. Bowel sounds are normal. She exhibits no distension.  There is no tenderness.  Musculoskeletal:  L hip is shortened and rotated  Neurological: She is alert. She is disoriented. GCS eye subscore is 4. GCS verbal subscore is 5. GCS motor subscore is 6.  Skin: Skin is warm and dry.  Psychiatric: She has a normal mood and affect. Her behavior is normal.  Nursing note and vitals reviewed.    ED Treatments / Results  Labs (all labs ordered are listed, but only abnormal results are displayed) Labs Reviewed  COMPREHENSIVE METABOLIC PANEL - Abnormal; Notable for the following components:      Result Value   Sodium 133 (*)    Chloride 96 (*)    Glucose, Bld 120 (*)    Total Protein 8.2 (*)    All other components within normal limits  URINALYSIS, COMPLETE (UACMP) WITH MICROSCOPIC - Abnormal; Notable for the following components:   Hgb urine dipstick MODERATE (*)    Bacteria, UA RARE (*)    All other components within normal limits  CBC WITH DIFFERENTIAL/PLATELET  CBG MONITORING, ED    EKG EKG Interpretation  Date/Time:  Thursday October 22 2018 09:34:31 EST Ventricular Rate:  74 PR Interval:    QRS Duration: 83 QT Interval:  434 QTC Calculation: 482 R Axis:   44 Text Interpretation:  Sinus rhythm Borderline T wave abnormality inferior leads No significant change since 11/20/2017 Confirmed by Veryl Speak [phone]) on 10/22/2018 9:56:53 AM   Radiology Ct Head Wo Contrast  Result Date: 10/22/2018 CLINICAL DATA:  Altered mental status. EXAM: CT HEAD WITHOUT CONTRAST TECHNIQUE: Contiguous axial images were obtained from the base of the skull through the vertex without intravenous contrast. COMPARISON:  CT scan of October 06, 2017. FINDINGS: Brain: Mild diffuse cortical atrophy is noted. Mild chronic ischemic white matter disease is noted. No mass effect or midline shift is noted. Ventricular size is within normal limits. There is no evidence of mass lesion, hemorrhage or acute infarction. Vascular: No hyperdense vessel or unexpected  calcification. Skull: Normal. Negative for fracture or focal lesion. Sinuses/Orbits: No acute finding. Other: None. IMPRESSION: Mild diffuse cortical atrophy. Mild chronic ischemic white matter disease. No acute intracranial abnormality seen. Electronically Signed   By: Marijo Conception, M.D.   On: 10/22/2018 10:37   Dg Chest Port 1 View  Result Date: 10/22/2018 CLINICAL DATA:  Productive cough, shortness of breath and weakness for 2 days. EXAM: PORTABLE  CHEST 1 VIEW COMPARISON:  Single-view of the chest 08/15/2018 and 11/26/2017. CT chest 10/09/2017. FINDINGS: Subsegmental atelectasis is seen in the lung bases. No consolidative process, pneumothorax or effusion. Calcific mediastinal lymphadenopathy noted. Heart size is normal. Aortic atherosclerosis is noted. Remote left rib fractures are seen. No acute bony abnormality. IMPRESSION: No acute disease. Electronically Signed   By: Inge Rise M.D.   On: 10/22/2018 10:11    Procedures Procedures (including critical care time)  Medications Ordered in ED Medications  sodium chloride 0.9 % bolus 500 mL (0 mLs Intravenous Stopped 10/22/18 1417)     Initial Impression / Assessment and Plan / ED Course  I have reviewed the triage vital signs and the nursing notes.  Pertinent labs & imaging results that were available during my care of the patient were reviewed by me and considered in my medical decision making (see chart for details).  84 year old female presents with syncope with chills and a couple episodes of N/V. Her vitals are normal. Exam is overall unrevealing. CBC is normal. CMP is remarkable for mild hyponatremia/hypochloremia. UA is normal appearing. CT head is negative. CXR is negative. She was given a 500cc bolus. Shared visit with Dr. Stark Jock. On recheck, she is feeling much better. She is alert and coherent. She is eating and drinking. Unclear of exact etiology of symptoms. Will d/c home. Return precautions given.  Final Clinical  Impressions(s) / ED Diagnoses   Final diagnoses:  Chills (without fever)  Syncope, unspecified syncope type  Nausea and vomiting, intractability of vomiting not specified, unspecified vomiting type    ED Discharge Orders    None       Recardo Evangelist, PA-C 10/22/18 1511    Veryl Speak, MD 10/22/18 1539

## 2018-10-22 NOTE — ED Triage Notes (Signed)
[facility]- pt had a witnessed syncopal episode on the toilet (was not straining) this morning. Pt has had multiple episodes of this since yesterday with N/V. Pt complains of generalized weakness. DNR with PT.

## 2018-10-22 NOTE — ED Notes (Addendum)
Pt's husband in room now, pt is normally axox4. NO baseline confusion or dementia. He states that the past couple days she has been getting her words wrong, the last 2 nights pt has vomited after taking her meds. Pt has tremor and is shaking constantly, pt's husband states that this is not normal.

## 2018-10-22 NOTE — Discharge Instructions (Signed)
Please follow up with your doctor °Return if worsening °

## 2019-09-07 IMAGING — DX DG CHEST 1V PORT
1 series · 1 of 1 positions shown · non-contrast
Comparison: Single-view of the chest 08/15/2018 and 11/26/2017. CT
chest 10/09/2017.

CLINICAL DATA: Productive cough, shortness of breath and weakness
for 2 days.

EXAM:
PORTABLE CHEST 1 VIEW

[chest ap]
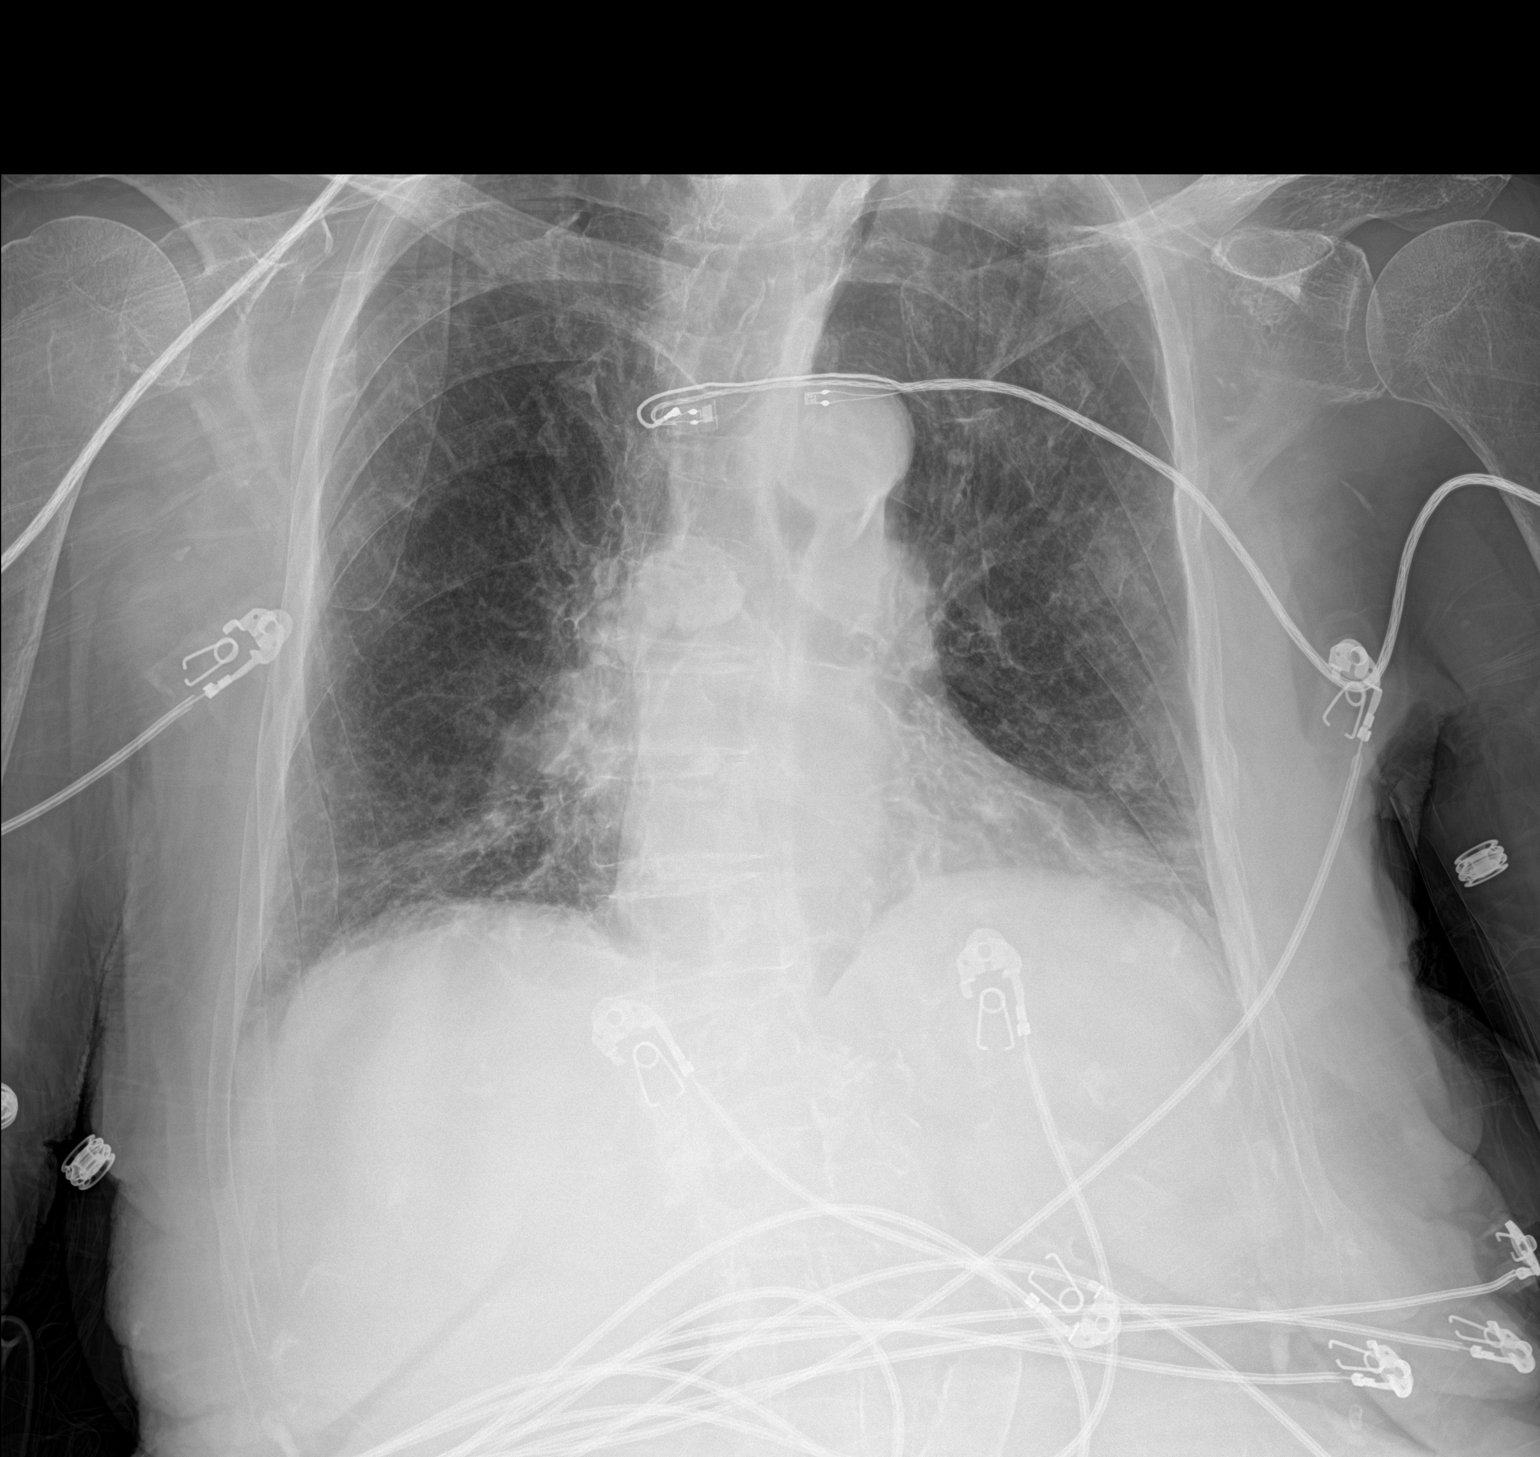

[1 of 1 positions shown; findings below may reference images not displayed]

FINDINGS: Subsegmental atelectasis is seen in the lung bases. No consolidative
process, pneumothorax or effusion. Calcific mediastinal
lymphadenopathy noted. Heart size is normal. Aortic atherosclerosis
is noted. Remote left rib fractures are seen. No acute bony
abnormality.
IMPRESSION: No acute disease.

## 2020-01-14 ENCOUNTER — Ambulatory Visit: Payer: Medicare Other | Attending: Internal Medicine

## 2020-01-14 DIAGNOSIS — Z23 Encounter for immunization: Secondary | ICD-10-CM | POA: Insufficient documentation

## 2020-01-14 NOTE — Progress Notes (Signed)
   Covid-19 Vaccination [facility]  Name:  Raven Snyder    MRN: [id number] DOB: [birth year]  01/14/2020  Raven Snyder was observed post Covid-19 immunization for 15 minutes without incidence. She was provided with Vaccine Information Sheet and instruction to access the V-Safe system.   Raven Snyder was instructed to call 911 with any severe reactions post vaccine: Marland Kitchen Difficulty breathing  . Swelling of your face and throat  . A fast heartbeat  . A bad rash all over your body  . Dizziness and weakness    Immunizations Administered    Name Date Dose VIS Date Route   Pfizer COVID-19 Vaccine 01/14/2020 10:45 AM 0.3 mL 10/29/2019 Intramuscular   Manufacturer: [place]   Lot: [id number]   [place]: [id number]

## 2020-02-09 ENCOUNTER — Ambulatory Visit: Payer: Medicare Other | Attending: Internal Medicine

## 2020-02-09 DIAGNOSIS — Z23 Encounter for immunization: Secondary | ICD-10-CM

## 2020-02-09 NOTE — Progress Notes (Signed)
   Covid-19 Vaccination Clinic  Name:  Raven Snyder    MRN: [id number] DOB: 15-Feb-1929  02/09/2020  [name] was observed post Covid-19 immunization for 15 minutes without incident. She was provided with Vaccine Information Sheet and instruction to access the V-Safe system.   [name] was instructed to call 911 with any severe reactions post vaccine: Marland Kitchen Difficulty breathing  . Swelling of face and throat  . A fast heartbeat  . A bad rash all over body  . Dizziness and weakness   Immunizations Administered    Name Date Dose VIS Date Route   Pfizer COVID-19 Vaccine 02/09/2020 10:02 AM 0.3 mL 10/29/2019 Intramuscular   Manufacturer: [place]   Lot: [id number]   [place]: [id number]

## 2021-01-16 DEATH — deceased
